# Patient Record
Sex: Female | Born: 1935 | Race: Black or African American | Hispanic: No | Marital: Married | Smoking: Never smoker
Health system: Southern US, Community
[De-identification: ages and names within clinical notes are randomized; demographics above are authoritative.]

## PROBLEM LIST (undated history)

## (undated) DIAGNOSIS — R001 Bradycardia, unspecified: Secondary | ICD-10-CM

## (undated) DIAGNOSIS — R55 Syncope and collapse: Secondary | ICD-10-CM

## (undated) DIAGNOSIS — I499 Cardiac arrhythmia, unspecified: Secondary | ICD-10-CM

## (undated) DIAGNOSIS — Z950 Presence of cardiac pacemaker: Secondary | ICD-10-CM

## (undated) DIAGNOSIS — I10 Essential (primary) hypertension: Secondary | ICD-10-CM

## (undated) HISTORY — DX: Essential (primary) hypertension: I10

## (undated) HISTORY — DX: Bradycardia, unspecified: R00.1

## (undated) HISTORY — DX: Syncope and collapse: R55

## (undated) HISTORY — DX: Presence of cardiac pacemaker: Z95.0

## (undated) HISTORY — DX: Cardiac arrhythmia, unspecified: I49.9

---

## 1998-04-17 ENCOUNTER — Ambulatory Visit (HOSPITAL_COMMUNITY): Admission: RE | Admit: 1998-04-17 | Discharge: 1998-04-17 | Payer: Self-pay | Admitting: Cardiology

## 2001-05-31 ENCOUNTER — Encounter: Payer: Self-pay | Admitting: Cardiology

## 2001-05-31 ENCOUNTER — Ambulatory Visit (HOSPITAL_COMMUNITY): Admission: RE | Admit: 2001-05-31 | Discharge: 2001-05-31 | Payer: Self-pay | Admitting: *Deleted

## 2002-10-16 ENCOUNTER — Encounter: Admission: RE | Admit: 2002-10-16 | Discharge: 2002-10-16 | Payer: Self-pay | Admitting: Cardiology

## 2002-10-16 ENCOUNTER — Encounter: Payer: Self-pay | Admitting: Cardiology

## 2006-01-06 ENCOUNTER — Encounter: Admission: RE | Admit: 2006-01-06 | Discharge: 2006-01-06 | Payer: Self-pay | Admitting: Cardiology

## 2008-05-08 ENCOUNTER — Inpatient Hospital Stay (HOSPITAL_COMMUNITY): Admission: EM | Admit: 2008-05-08 | Discharge: 2008-05-10 | Payer: Self-pay | Admitting: Emergency Medicine

## 2008-05-08 ENCOUNTER — Ambulatory Visit: Payer: Self-pay | Admitting: Internal Medicine

## 2008-05-09 ENCOUNTER — Encounter: Payer: Self-pay | Admitting: Internal Medicine

## 2008-05-09 HISTORY — PX: OTHER SURGICAL HISTORY: SHX169

## 2008-08-27 ENCOUNTER — Ambulatory Visit: Payer: Self-pay | Admitting: Internal Medicine

## 2009-02-21 ENCOUNTER — Encounter: Payer: Self-pay | Admitting: Internal Medicine

## 2009-04-30 DIAGNOSIS — I499 Cardiac arrhythmia, unspecified: Secondary | ICD-10-CM | POA: Insufficient documentation

## 2009-04-30 DIAGNOSIS — Z950 Presence of cardiac pacemaker: Secondary | ICD-10-CM

## 2009-04-30 DIAGNOSIS — I10 Essential (primary) hypertension: Secondary | ICD-10-CM

## 2009-04-30 DIAGNOSIS — I498 Other specified cardiac arrhythmias: Secondary | ICD-10-CM

## 2009-04-30 DIAGNOSIS — R55 Syncope and collapse: Secondary | ICD-10-CM | POA: Insufficient documentation

## 2009-05-12 ENCOUNTER — Ambulatory Visit: Payer: Self-pay | Admitting: Internal Medicine

## 2009-05-12 DIAGNOSIS — I4891 Unspecified atrial fibrillation: Secondary | ICD-10-CM | POA: Insufficient documentation

## 2009-05-16 ENCOUNTER — Ambulatory Visit: Payer: Self-pay | Admitting: Cardiovascular Disease

## 2009-05-16 ENCOUNTER — Encounter (INDEPENDENT_AMBULATORY_CARE_PROVIDER_SITE_OTHER): Payer: Self-pay | Admitting: Cardiology

## 2009-05-21 ENCOUNTER — Encounter (INDEPENDENT_AMBULATORY_CARE_PROVIDER_SITE_OTHER): Payer: Self-pay | Admitting: Cardiology

## 2009-05-21 ENCOUNTER — Ambulatory Visit: Payer: Self-pay | Admitting: Cardiovascular Disease

## 2009-05-21 LAB — CONVERTED CEMR LAB: Prothrombin Time: 20.2 s

## 2009-05-28 ENCOUNTER — Ambulatory Visit: Payer: Self-pay | Admitting: Cardiology

## 2009-05-28 LAB — CONVERTED CEMR LAB
POC INR: 5.3
Prothrombin Time: 27.9 s

## 2009-06-05 ENCOUNTER — Ambulatory Visit: Payer: Self-pay | Admitting: Cardiovascular Disease

## 2009-06-05 ENCOUNTER — Ambulatory Visit: Payer: Self-pay | Admitting: Internal Medicine

## 2009-06-05 LAB — CONVERTED CEMR LAB
POC INR: 3.6
Prothrombin Time: 22.9 s

## 2009-06-17 ENCOUNTER — Ambulatory Visit: Payer: Self-pay | Admitting: Cardiology

## 2009-06-17 LAB — CONVERTED CEMR LAB
POC INR: 2.5
Prothrombin Time: 19.3 s

## 2009-07-04 ENCOUNTER — Ambulatory Visit: Payer: Self-pay | Admitting: Cardiology

## 2009-07-28 ENCOUNTER — Ambulatory Visit: Payer: Self-pay | Admitting: Cardiovascular Disease

## 2009-08-25 ENCOUNTER — Ambulatory Visit: Payer: Self-pay | Admitting: Internal Medicine

## 2009-08-25 LAB — CONVERTED CEMR LAB: POC INR: 2.8

## 2009-09-04 ENCOUNTER — Ambulatory Visit: Payer: Self-pay | Admitting: Internal Medicine

## 2009-09-22 ENCOUNTER — Ambulatory Visit: Payer: Self-pay | Admitting: Internal Medicine

## 2009-10-17 ENCOUNTER — Encounter (INDEPENDENT_AMBULATORY_CARE_PROVIDER_SITE_OTHER): Payer: Self-pay | Admitting: Cardiology

## 2009-10-17 ENCOUNTER — Ambulatory Visit: Payer: Self-pay | Admitting: Cardiovascular Disease

## 2009-10-17 LAB — CONVERTED CEMR LAB: POC INR: 3.6

## 2009-11-06 ENCOUNTER — Ambulatory Visit: Payer: Self-pay | Admitting: Cardiology

## 2009-11-06 LAB — CONVERTED CEMR LAB: POC INR: 2.5

## 2009-12-04 ENCOUNTER — Ambulatory Visit: Payer: Self-pay | Admitting: Cardiology

## 2009-12-04 ENCOUNTER — Ambulatory Visit: Payer: Self-pay | Admitting: Internal Medicine

## 2009-12-04 LAB — CONVERTED CEMR LAB: POC INR: 1.9

## 2009-12-25 ENCOUNTER — Ambulatory Visit: Payer: Self-pay | Admitting: Cardiology

## 2009-12-25 LAB — CONVERTED CEMR LAB: POC INR: 2

## 2010-01-15 ENCOUNTER — Ambulatory Visit: Payer: Self-pay | Admitting: Cardiology

## 2010-02-12 ENCOUNTER — Ambulatory Visit: Payer: Self-pay | Admitting: Internal Medicine

## 2010-02-12 LAB — CONVERTED CEMR LAB: POC INR: 2

## 2010-03-05 ENCOUNTER — Ambulatory Visit: Payer: Self-pay | Admitting: Internal Medicine

## 2010-03-12 ENCOUNTER — Ambulatory Visit: Payer: Self-pay | Admitting: Cardiology

## 2010-04-09 ENCOUNTER — Ambulatory Visit: Payer: Self-pay | Admitting: Cardiology

## 2010-04-17 ENCOUNTER — Telehealth: Payer: Self-pay | Admitting: Nurse Practitioner

## 2010-05-07 ENCOUNTER — Ambulatory Visit: Payer: Self-pay | Admitting: Internal Medicine

## 2010-05-22 ENCOUNTER — Ambulatory Visit: Payer: Self-pay | Admitting: Cardiology

## 2010-05-22 LAB — CONVERTED CEMR LAB: POC INR: 2.5

## 2010-06-04 ENCOUNTER — Ambulatory Visit: Payer: Self-pay | Admitting: Internal Medicine

## 2010-06-15 ENCOUNTER — Ambulatory Visit: Payer: Self-pay | Admitting: Cardiovascular Disease

## 2010-06-15 ENCOUNTER — Ambulatory Visit: Payer: Self-pay | Admitting: Internal Medicine

## 2010-07-13 ENCOUNTER — Ambulatory Visit: Payer: Self-pay | Admitting: Cardiology

## 2010-08-10 ENCOUNTER — Ambulatory Visit: Payer: Self-pay | Admitting: Cardiovascular Disease

## 2010-08-10 LAB — CONVERTED CEMR LAB: POC INR: 2.7

## 2010-09-03 ENCOUNTER — Ambulatory Visit: Payer: Self-pay | Admitting: Internal Medicine

## 2010-09-07 ENCOUNTER — Ambulatory Visit: Payer: Self-pay | Admitting: Internal Medicine

## 2010-09-07 LAB — CONVERTED CEMR LAB: POC INR: 2.3

## 2010-10-05 ENCOUNTER — Ambulatory Visit: Payer: Self-pay | Admitting: Cardiovascular Disease

## 2010-10-05 LAB — CONVERTED CEMR LAB: POC INR: 1.8

## 2010-11-03 ENCOUNTER — Ambulatory Visit: Payer: Self-pay | Admitting: Cardiology

## 2010-12-03 ENCOUNTER — Encounter: Payer: Self-pay | Admitting: Internal Medicine

## 2010-12-05 ENCOUNTER — Encounter: Payer: Self-pay | Admitting: Cardiology

## 2010-12-08 ENCOUNTER — Ambulatory Visit: Admission: RE | Admit: 2010-12-08 | Discharge: 2010-12-08 | Payer: Self-pay | Source: Home / Self Care

## 2010-12-08 LAB — CONVERTED CEMR LAB: POC INR: 1.9

## 2010-12-10 ENCOUNTER — Ambulatory Visit: Payer: Self-pay | Admitting: Internal Medicine

## 2010-12-15 NOTE — Medication Information (Signed)
Summary: rov coumadin - [facility]  Anticoagulant Therapy  Managed by: Weston Brass, PharmD Referring MD: Lewayne Bunting, MD Supervising MD: Daleen Squibb MD, Maisie Fus Indication 1: Atrial Fibrillation Lab Used: [facility] [place] Site: [address] INR POC 2.1 INR RANGE 2 - 3  Dietary changes: no    Health status changes: no    Bleeding/hemorrhagic complications: no    Recent/future hospitalizations: no    Any changes in medication regimen? no    Recent/future dental: no  Any missed doses?: no       Is patient compliant with meds? yes       Allergies: No Known Drug Allergies  Anticoagulation Management History:      The patient is taking warfarin and comes in today for a routine follow up visit.  Positive risk factors for bleeding include an age of 75 years or older.  The bleeding index is 'intermediate risk'.  Positive CHADS2 values include History of HTN and Age > 75 years old.  Anticoagulation responsible provider: Daleen Squibb MD, Maisie Fus.  INR POC: 2.1.  Cuvette Lot#: [id number].  Exp: 08/12.    Anticoagulation Management Assessment/Plan:      The patient's current anticoagulation dose is Coumadin 3 mg tabs: one daily as directed per coumadin clinic.  The target INR is 2.0-3.0.  The next INR is due 05/07/2010.  Anticoagulation instructions were given to patient.  Results were reviewed/authorized by Weston Brass, PharmD.  She was notified by Weston Brass PharmD.         Prior Anticoagulation Instructions: INR 2.0  coumadin 1 tab =3mg  Tu, Thu, Sat, Sun 1/2 tab = 1.5mg  on Mon, Wed, Fri  Current Anticoagulation Instructions: INR 2.1  Continue same dose of 1 tablet every day except 1/2 tablet on Monday, Wednesday and Friday

## 2010-12-15 NOTE — Cardiovascular Report (Signed)
Summary: TTM   TTM   Imported By: Roderic Ovens 09/15/2010 15:21:29  _____________________________________________________________________  External Attachment:    Type:   Image     Comment:   External Document

## 2010-12-15 NOTE — Cardiovascular Report (Signed)
Summary: TTM   TTM   Imported By: Roderic Ovens 01/12/2010 13:32:47  _____________________________________________________________________  External Attachment:    Type:   Image     Comment:   External Document

## 2010-12-15 NOTE — Medication Information (Signed)
Summary: rov coumadin - [facility]  Anticoagulant Therapy  Managed by: Leota Sauers, PharmD, BCPS, CPP Referring MD: Lewayne Bunting, MD Supervising MD: Daleen Squibb MD, Maisie Fus Indication 1: Atrial Fibrillation Lab Used: [facility]  Site: [address] INR POC 2.0 INR RANGE 2 - 3  Dietary changes: no    Health status changes: no    Bleeding/hemorrhagic complications: no    Recent/future hospitalizations: no    Any changes in medication regimen? no    Recent/future dental: no  Any missed doses?: no       Is patient compliant with meds? yes       Current Medications (verified): 1)  Lumigan 0.03 % Soln (Bimatoprost) .Marland Kitchen.. 1 Drop Both Eyes Daily 2)  Multivitamins   Tabs (Multiple Vitamin) .Marland Kitchen.. 1 By Mouth Once Daily 3)  Dorzolamide Hcl 2 % Soln (Dorzolamide Hcl) .Marland Kitchen.. 1 Drop Both Eyes Two Times A Day 4)  Exforge 10-160 Mg Tabs (Amlodipine Besylate-Valsartan) .Marland Kitchen.. 1 By Mouth Once Daily 5)  Coumadin 3 Mg Tabs (Warfarin Sodium) .... One Daily As Directed Per Coumadin Clinic 6)  Maxzide-25 37.5-25 Mg Tabs (Triamterene-Hctz) .... One By Mouth Once Daily  Allergies (verified): No Known Drug Allergies  Anticoagulation Management History:      The patient is taking warfarin and comes in today for a routine follow up visit.  Positive risk factors for bleeding include an age of 14 years or older.  The bleeding index is 'intermediate risk'.  Positive CHADS2 values include History of HTN and Age > 52 years old.  Anticoagulation responsible provider: Daleen Squibb MD, Maisie Fus.  INR POC: 2.0.  Cuvette Lot#: [id number].  Exp: 03/2011.    Anticoagulation Management Assessment/Plan:      The patient's current anticoagulation dose is Coumadin 3 mg tabs: one daily as directed per coumadin clinic.  The next INR is due 04/09/2010.  Anticoagulation instructions were given to patient.  Results were reviewed/authorized by Leota Sauers, PharmD, BCPS, CPP.         Prior Anticoagulation Instructions: INR  2.0  Coumadin 1/2 tab = 1.5mg  on Mon, Wed, Fri 1 tab = 3mg  Tu, Thu, Sat, Sun  Current Anticoagulation Instructions: INR 2.0  coumadin 1 tab =3mg  Tu, Thu, Sat, Sun 1/2 tab = 1.5mg  on Mon, Wed, Fri

## 2010-12-15 NOTE — Medication Information (Signed)
Summary: rov/ewj  Anticoagulant Therapy  Managed by: Leota Sauers, PharmD, BCPS, CPP Referring MD: Lewayne Bunting, MD Supervising MD: Ladona Ridgel MD, Sharlot Gowda Indication 1: Atrial Fibrillation Lab Used: [facility] [place] Site: [address] INR POC 2.0 INR RANGE 2 - 3  Dietary changes: no    Health status changes: no    Bleeding/hemorrhagic complications: no    Recent/future hospitalizations: no    Any changes in medication regimen? no    Recent/future dental: no  Any missed doses?: no       Is patient compliant with meds? yes       Current Medications (verified): 1)  Lumigan 0.03 % Soln (Bimatoprost) .Marland Kitchen.. 1 Drop Both Eyes Daily 2)  Multivitamins   Tabs (Multiple Vitamin) .Marland Kitchen.. 1 By Mouth Once Daily 3)  Dorzolamide Hcl 2 % Soln (Dorzolamide Hcl) .Marland Kitchen.. 1 Drop Both Eyes Two Times A Day 4)  Exforge 10-160 Mg Tabs (Amlodipine Besylate-Valsartan) .Marland Kitchen.. 1 By Mouth Once Daily 5)  Coumadin 3 Mg Tabs (Warfarin Sodium) .... One Daily As Directed Per Coumadin Clinic 6)  Maxzide-25 37.5-25 Mg Tabs (Triamterene-Hctz) .... One By Mouth Once Daily  Allergies (verified): No Known Drug Allergies  Anticoagulation Management History:      The patient is taking warfarin and comes in today for a routine follow up visit.  Positive risk factors for bleeding include an age of 28 years or older.  The bleeding index is 'intermediate risk'.  Positive CHADS2 values include History of HTN and Age > 7 years old.  Anticoagulation responsible provider: Ladona Ridgel MD, Sharlot Gowda.  INR POC: 2.0.  Cuvette Lot#: [id number].  Exp: 03/2011.    Anticoagulation Management Assessment/Plan:      The patient's current anticoagulation dose is Coumadin 3 mg tabs: one daily as directed per coumadin clinic.  The next INR is due 03/12/2010.  Anticoagulation instructions were given to patient.  Results were reviewed/authorized by Leota Sauers, PharmD, BCPS, CPP.         Prior Anticoagulation Instructions: INR  2.5  Continue on same dosage 1 tablet daily except 1/2 tablet on Mondays, Wednesdays, and Fridays.  Recheck in 4 weeks.    Current Anticoagulation Instructions: INR 2.0  Coumadin 1/2 tab = 1.5mg  on Mon, Wed, Fri 1 tab = 3mg  Tu, Thu, Sat, Sun

## 2010-12-15 NOTE — Assessment & Plan Note (Signed)
Summary: 1 year rov   Visit Type:  Follow-up Primary Provider:  Shana Chute, Md   History of Present Illness: [name] [name] returns today for PPM and atrial fibrillation followup.  She is a pleasant patient of Dr. Magda Kiel with a h/o atrial fibrillation and bradycardia.  She was started on coumadin in her last visit for thromboembolic prevention.  She denies c/p or sob. she has not had any falls.  She admits to some sodium indiscretion and has had some swelling in her feet.    Current Medications (verified): 1)  Lumigan 0.03 % Soln (Bimatoprost) .Marland Kitchen.. 1 Drop Both Eyes Daily 2)  Multivitamins   Tabs (Multiple Vitamin) .Marland Kitchen.. 1 By Mouth Once Daily 3)  Dorzolamide Hcl 2 % Soln (Dorzolamide Hcl) .Marland Kitchen.. 1 Drop Both Eyes Two Times A Day 4)  Exforge 10-160 Mg Tabs (Amlodipine Besylate-Valsartan) .Marland Kitchen.. 1 By Mouth Once Daily 5)  Coumadin 3 Mg Tabs (Warfarin Sodium) .... One Daily As Directed Per Coumadin Clinic 6)  Maxzide-25 37.5-25 Mg Tabs (Triamterene-Hctz) .... One By Mouth Once Daily  Allergies (verified): No Known Drug Allergies  Past History:  Past Medical History: Last updated: 04/30/2009 CARDIAC ARRHYTHMIA (ICD-427.9) HYPERTENSION (ICD-401.9) PACEMAKER, PERMANENT (ICD-V45.01) SYNCOPE (ICD-780.2) BRADYCARDIA (ICD-427.89)  Past Surgical History: Last updated: 04/30/2009  Implantation of dual-chamber pacemaker [employer] dual-chamber  05/09/2008 Dr.Demarques Pilz  Social History: Last updated: 04/30/2009 Tobacco Use - No.  Alcohol Use - no Drug Use - no  Review of Systems       The patient complains of peripheral edema.  The patient denies chest pain, syncope, and dyspnea on exertion.    Vital Signs:  Patient profile:   77 year old female Height:      62 inches Weight:      130 pounds BMI:     23.86 Pulse rate:   59 / minute BP sitting:   122 / 60  (left arm)  Vitals Entered By: Laurance Flatten CMA (June 15, 2010 2:18 PM)  Physical Exam  General:  Well developed, well nourished, in no  acute distress. Head:  normocephalic and atraumatic Eyes:  PERRLA/EOM intact; conjunctiva and lids normal. Mouth:  Teeth, gums and palate normal. Oral mucosa normal. Neck:  Neck supple, no JVD. No masses, thyromegaly or abnormal cervical nodes. Chest Wall:  Well healed PPM incision. Lungs:  Clear bilaterally to auscultation with no wheezes, rales, or rhonchi. Heart:  RRR with normal S1 and S2.  PMI is not enlarged or laterally displaced. Abdomen:  Bowel sounds positive; abdomen soft and non-tender without masses, organomegaly, or hernias noted. No hepatosplenomegaly. Msk:  Back normal, normal gait. Muscle strength and tone normal. Pulses:  pulses normal in all 4 extremities Extremities:  No clubbing or cyanosis. Neurologic:  Alert and oriented x 3.   PPM Specifications Following MD:  Lewayne Bunting, MD     PPM Vendor:  [employer]     PPM Model Number:  [id number]     PPM Serial Number:  [id number] PPM DOI:  05/09/2008     PPM Implanting MD:  Lewayne Bunting, MD  Lead 1    Location: [facility]     DOI: 05/09/2008     Model #: [id number]     Serial #: [id number]     Status: active Lead 2    Location: [facility]     DOI: 05/09/2008     Model #: [id number]     Serial #: [id number]     Status: active   Indications:  BRADYCARDIA  PPM Follow Up Battery Voltage:  2.79 V     Battery Est. Longevity:  4.50-5.15yrs     Pacer Dependent:  No       PPM Device Measurements Atrium  Amplitude: 3.9 mV, Impedance: 364 ohms, Threshold: 0.75 V at 0.4 msec Right Ventricle  Amplitude: 9.1 mV, Impedance: 413 ohms, Threshold: 0.750 V at 0.4 msec  Episodes MS Episodes:  3     Percent Mode Switch:  <1%     Coumadin:  Yes Ventricular High Rate:  0     Atrial Pacing:  99%     Ventricular Pacing:  <1%  Parameters Mode:  DDDR     Lower Rate Limit:  60     Upper Rate Limit:  100 Paced AV Delay:  250     Sensed AV Delay:  250 Next Cardiology Appt Due:  12/17/2010 Tech Comments:  3 AMS EPISODES--LONGEST WAS 10 HRS 13 MINUTES.  +COUMADIN.  NORMAL DEVICE  FUNCTION.   NO CHANGES MADE.  ROV IN 6 MTHS W/DEVICE CLINIC.  MEDNET CHECK IN [place] MD Comments:  Agree with above.  Impression & Recommendations:  Problem # 1:  PACEMAKER, PERMANENT (ICD-V45.01) Her PPM is working normally.  Will recheck in several months.  Problem # 2:  ATRIAL FIBRILLATION (ICD-427.31) She remains asymptomatic.  She will continue warfarin. Her updated medication list for this problem includes:    Coumadin 3 Mg Tabs (Warfarin sodium) ..... One daily as directed per coumadin clinic  Problem # 3:  HYPERTENSION (ICD-401.9) Her blood pressure is well controlled.  She will continue her meds as below and maintain a low sodium diet. Her updated medication list for this problem includes:    Exforge 10-160 Mg Tabs (Amlodipine besylate-valsartan) .Marland Kitchen... 1 by mouth once daily    Maxzide-25 37.5-25 Mg Tabs (Triamterene-hctz) ..... One by mouth once daily  Patient Instructions: 1)  Your physician recommends that you schedule a follow-up appointment in: 12 months with Dr Ladona Ridgel

## 2010-12-15 NOTE — Medication Information (Signed)
Summary: rov/sl  Anticoagulant Therapy  Managed by: Weston Brass, PharmD Referring MD: Lewayne Bunting, MD PCP: Shana Chute, Md Supervising MD: Myrtis Ser MD, Tinnie Gens Indication 1: Atrial Fibrillation Lab Used: [facility] [place] Site: [address] INR POC 2.8 INR RANGE 2 - 3  Dietary changes: no    Health status changes: no    Bleeding/hemorrhagic complications: no    Recent/future hospitalizations: no    Any changes in medication regimen? no    Recent/future dental: no  Any missed doses?: no       Is patient compliant with meds? yes       Allergies: No Known Drug Allergies  Anticoagulation Management History:      The patient is taking warfarin and comes in today for a routine follow up visit.  Positive risk factors for bleeding include an age of 75 years or older.  The bleeding index is 'intermediate risk'.  Positive CHADS2 values include History of HTN and Age > 58 years old.  Anticoagulation responsible provider: Myrtis Ser MD, Tinnie Gens.  INR POC: 2.8.  Cuvette Lot#: [id number].  Exp: 08/2011.    Anticoagulation Management Assessment/Plan:      The patient's current anticoagulation dose is Coumadin 3 mg tabs: one daily as directed per coumadin clinic.  The target INR is 2.0-3.0.  The next INR is due 08/10/2010.  Anticoagulation instructions were given to patient.  Results were reviewed/authorized by Weston Brass, PharmD.  She was notified by Liana Gerold, PharmD Candidate.         Prior Anticoagulation Instructions: INR 2.1   Continue on Coumadin 1 tab on Sun, Tue, Thur, Sat and Coumadin 0.5 tab on Mon, Wed, Friday. Return to clinic in 4 weeks.  Current Anticoagulation Instructions: INR 2.8  Continue 1 tablet daily except 1/2 on Mon, Wed and Fri.  Return to clinic in 4 weeks.

## 2010-12-15 NOTE — Medication Information (Signed)
Summary: [name] [name]  Anticoagulant Therapy  Managed by: Cloyde Reams, RN, BSN Referring MD: Lewayne Bunting, MD Supervising MD: Jens Som MD, Arlys John Indication 1: Atrial Fibrillation Lab Used: [facility] [place] Site: [address] INR POC 2.5 INR RANGE 2 - 3  Dietary changes: yes       Details: Eating a lot of greens per pt report.    Health status changes: no    Bleeding/hemorrhagic complications: no    Recent/future hospitalizations: no    Any changes in medication regimen? no    Recent/future dental: no  Any missed doses?: no       Is patient compliant with meds? yes       Allergies (verified): No Known Drug Allergies  Anticoagulation Management History:      The patient is taking warfarin and comes in today for a routine follow up visit.  Positive risk factors for bleeding include an age of 91 years or older.  The bleeding index is 'intermediate risk'.  Positive CHADS2 values include History of HTN and Age > 87 years old.  Anticoagulation responsible provider: Jens Som MD, Arlys John.  INR POC: 2.5.  Cuvette Lot#: [id number].  Exp: 03/2011.    Anticoagulation Management Assessment/Plan:      The patient's current anticoagulation dose is Coumadin 3 mg tabs: one daily as directed per coumadin clinic.  The next INR is due 02/12/2010.  Anticoagulation instructions were given to patient.  Results were reviewed/authorized by Cloyde Reams, RN, BSN.  She was notified by Cloyde Reams RN.         Prior Anticoagulation Instructions: INR 2.0 Change dose to 1 pill everyday except 1/2 pill on Mondays, Wednesdays and Fridays. Recheck in 3 weeks.   Current Anticoagulation Instructions: INR 2.5  Continue on same dosage 1 tablet daily except 1/2 tablet on Mondays, Wednesdays, and Fridays.  Recheck in 4 weeks.

## 2010-12-15 NOTE — Medication Information (Signed)
Summary: rov/cb  Anticoagulant Therapy  Managed by: Shelby Dubin, PharmD, BCPS, CPP Referring MD: Lewayne Bunting, MD Supervising MD: Riley Kill MD, Maisie Fus Indication 1: Atrial Fibrillation Lab Used: [facility] [place] Site: [address] INR POC 1.9 INR RANGE 2 - 3  Dietary changes: no    Health status changes: no    Bleeding/hemorrhagic complications: no    Recent/future hospitalizations: no    Any changes in medication regimen? yes       Details: Started Doxycycline 100mg  twice daily dose for 5 days ( on 11/20/2009) and besivance (besifloxacin) eye drop on 11/20/2009  Recent/future dental: no  Any missed doses?: no       Is patient compliant with meds? yes       Allergies: No Known Drug Allergies  Anticoagulation Management History:      The patient is taking warfarin and comes in today for a routine follow up visit.  Positive risk factors for bleeding include an age of 75 years or older.  The bleeding index is 'intermediate risk'.  Positive CHADS2 values include History of HTN and Age > 75 years old.  Anticoagulation responsible provider: Riley Kill MD, Maisie Fus.  INR POC: 1.9.  Cuvette Lot#: [id number].  Exp: 12/2010.    Anticoagulation Management Assessment/Plan:      The patient's current anticoagulation dose is Coumadin 3 mg tabs: one daily as directed per coumadin clinic.  The next INR is due 12/25/2009.  Anticoagulation instructions were given to patient.  Results were reviewed/authorized by Shelby Dubin, PharmD, BCPS, CPP.  She was notified by Ysidro Evert, Pharm D Candidate.         Prior Anticoagulation Instructions: INR 2.5. Take 0.5 tablet daily except 1 tablet Tuesday, Thursday, and Sunday.  Current Anticoagulation Instructions: INR: 1.9 Take extra 1/2 tablet ( total of 1 tablet = 3mg ) tomorrow then resume to same dosage of 1/2 tablet daily except 1 tablet on Sundays, Tuesdays and Thursdays Recheck in 3 weeks

## 2010-12-15 NOTE — Cardiovascular Report (Signed)
Summary: Office Visit   Office Visit   Imported By: Roderic Ovens 06/16/2010 09:32:52  _____________________________________________________________________  External Attachment:    Type:   Image     Comment:   External Document

## 2010-12-15 NOTE — Medication Information (Signed)
Summary: rov/ewj  Anticoagulant Therapy  Managed by: Reina Fuse, PharmD Referring MD: Lewayne Bunting, MD Supervising MD: Eden Emms MD, Theron Arista Indication 1: Atrial Fibrillation Lab Used: [facility] [place] Site: [address] INR POC 2.1 INR RANGE 2 - 3  Dietary changes: no    Health status changes: no    Bleeding/hemorrhagic complications: no    Recent/future hospitalizations: no    Any changes in medication regimen? no    Recent/future dental: no  Any missed doses?: no       Is patient compliant with meds? yes       Current Medications (verified): 1)  Lumigan 0.03 % Soln (Bimatoprost) .Marland Kitchen.. 1 Drop Both Eyes Daily 2)  Multivitamins   Tabs (Multiple Vitamin) .Marland Kitchen.. 1 By Mouth Once Daily 3)  Dorzolamide Hcl 2 % Soln (Dorzolamide Hcl) .Marland Kitchen.. 1 Drop Both Eyes Two Times A Day 4)  Exforge 10-160 Mg Tabs (Amlodipine Besylate-Valsartan) .Marland Kitchen.. 1 By Mouth Once Daily 5)  Coumadin 3 Mg Tabs (Warfarin Sodium) .... One Daily As Directed Per Coumadin Clinic 6)  Maxzide-25 37.5-25 Mg Tabs (Triamterene-Hctz) .... One By Mouth Once Daily  Allergies (verified): No Known Drug Allergies  Anticoagulation Management History:      The patient is taking warfarin and comes in today for a routine follow up visit.  Positive risk factors for bleeding include an age of 64 years or older.  The bleeding index is 'intermediate risk'.  Positive CHADS2 values include History of HTN and Age > 14 years old.  Anticoagulation responsible provider: Eden Emms MD, Theron Arista.  INR POC: 2.1.  Cuvette Lot#: [id number].  Exp: 08/2011.    Anticoagulation Management Assessment/Plan:      The patient's current anticoagulation dose is Coumadin 3 mg tabs: one daily as directed per coumadin clinic.  The target INR is 2.0-3.0.  The next INR is due 07/13/2010.  Anticoagulation instructions were given to patient.  Results were reviewed/authorized by Reina Fuse, PharmD.  She was notified by Reina Fuse PharmD.         Prior  Anticoagulation Instructions: INR 2.5  Continue on same dosage 1 tablet daily except 1/2 tablet on Mondays, Wednesdays, and Fridays.  Recheck in 3 weeks.   Current Anticoagulation Instructions: INR 2.1   Continue on Coumadin 1 tab on Sun, Tue, Thur, Sat and Coumadin 0.5 tab on Mon, Wed, Friday. Return to clinic in 4 weeks.

## 2010-12-15 NOTE — Medication Information (Signed)
Summary: rov/ewj  Anticoagulant Therapy  Managed by: Leota Sauers, PharmD, BCPS, CPP Referring MD: Lewayne Bunting, MD PCP: Shana Chute Md Supervising MD: Excell Seltzer MD, Casimiro Needle Indication 1: Atrial Fibrillation Lab Used: [facility] [place] Site: [address] INR POC 1.8 INR RANGE 2 - 3  Dietary changes: no    Health status changes: no    Bleeding/hemorrhagic complications: no    Recent/future hospitalizations: no    Any changes in medication regimen? no    Recent/future dental: no  Any missed doses?: no       Is patient compliant with meds? yes       Current Medications (verified): 1)  Lumigan 0.03 % Soln (Bimatoprost) .Marland Kitchen.. 1 Drop Both Eyes Daily 2)  Multivitamins   Tabs (Multiple Vitamin) .Marland Kitchen.. 1 By Mouth Once Daily 3)  Dorzolamide Hcl 2 % Soln (Dorzolamide Hcl) .Marland Kitchen.. 1 Drop Both Eyes Two Times A Day 4)  Exforge 10-160 Mg Tabs (Amlodipine Besylate-Valsartan) .Marland Kitchen.. 1 By Mouth Once Daily 5)  Coumadin 3 Mg Tabs (Warfarin Sodium) .... One Daily As Directed Per Coumadin Clinic 6)  Maxzide-25 37.5-25 Mg Tabs (Triamterene-Hctz) .... One By Mouth Once Daily  Allergies (verified): No Known Drug Allergies  Anticoagulation Management History:      The patient is taking warfarin and comes in today for a routine follow up visit.  Positive risk factors for bleeding include an age of 75 years or older.  The bleeding index is 'intermediate risk'.  Positive CHADS2 values include History of HTN and Age > 75 years old.  Anticoagulation responsible provider: Excell Seltzer MD, Casimiro Needle.  INR POC: 1.8.  Cuvette Lot#: [id number].  Exp: 10/2011.    Anticoagulation Management Assessment/Plan:      The patient's current anticoagulation dose is Coumadin 3 mg tabs: one daily as directed per coumadin clinic.  The target INR is 2.0-3.0.  The next INR is due 11/03/2010.  Anticoagulation instructions were given to patient.  Results were reviewed/authorized by Leota Sauers, PharmD, BCPS, CPP.           Prior Anticoagulation Instructions: INR 2.3  Continue on same dosage 1 tablet daily except 1/2 tablet on Mondays, Wednesdays, and Fridays.  Recheck in 4 weeks.    Current Anticoagulation Instructions: INR 1.8  Couamdin 3mg  tabs, Take 1 whole tab today then 1 tab TUE, THUR, SAT, SUN and 1/2 tab on MON, WED, FRI

## 2010-12-15 NOTE — Medication Information (Signed)
Summary: [name] [name]  Anticoagulant Therapy  Managed by: Cloyde Reams, RN, BSN Referring MD: Lewayne Bunting, MD Supervising MD: Antoine Poche MD, Fayrene Fearing Indication 1: Atrial Fibrillation Lab Used: [facility] [place] Site: [address] INR POC 2.5 INR RANGE 2 - 3  Dietary changes: no    Health status changes: no    Bleeding/hemorrhagic complications: no    Recent/future hospitalizations: no    Any changes in medication regimen? no    Recent/future dental: no  Any missed doses?: no       Is patient compliant with meds? yes       Allergies: No Known Drug Allergies  Anticoagulation Management History:      The patient is taking warfarin and comes in today for a routine follow up visit.  Positive risk factors for bleeding include an age of 75 years or older.  The bleeding index is 'intermediate risk'.  Positive CHADS2 values include History of HTN and Age > 75 years old.  Anticoagulation responsible provider: Antoine Poche MD, Fayrene Fearing.  INR POC: 2.5.  Cuvette Lot#: [id number].  Exp: 07/2011.    Anticoagulation Management Assessment/Plan:      The patient's current anticoagulation dose is Coumadin 3 mg tabs: one daily as directed per coumadin clinic.  The target INR is 2.0-3.0.  The next INR is due 06/15/2010.  Anticoagulation instructions were given to patient.  Results were reviewed/authorized by Cloyde Reams, RN, BSN.  She was notified by Cloyde Reams RN.         Prior Anticoagulation Instructions: INR 1.4  Take an extra 1/2 tablet today then resume same dosage 1 tablet daily except 1/2 tablet on Mondays, Wednesdays, and Fridays.  Recheck in 2 weeks.    Current Anticoagulation Instructions: INR 2.5  Continue on same dosage 1 tablet daily except 1/2 tablet on Mondays, Wednesdays, and Fridays.  Recheck in 3 weeks.

## 2010-12-15 NOTE — Medication Information (Signed)
Summary: [name] [name]  Anticoagulant Therapy  Managed by: Cloyde Reams, RN, BSN Referring MD: Lewayne Bunting, MD PCP: Shana Chute Md Supervising MD: Johney Frame MD, Fayrene Fearing Indication 1: Atrial Fibrillation Lab Used: [facility] [place] Site: [address] INR POC 2.3 INR RANGE 2 - 3  Dietary changes: no    Health status changes: no    Bleeding/hemorrhagic complications: no    Recent/future hospitalizations: no    Any changes in medication regimen? no    Recent/future dental: no  Any missed doses?: no       Is patient compliant with meds? yes       Allergies: No Known Drug Allergies  Anticoagulation Management History:      The patient is taking warfarin and comes in today for a routine follow up visit.  Positive risk factors for bleeding include an age of 75 years or older.  The bleeding index is 'intermediate risk'.  Positive CHADS2 values include History of HTN and Age > 47 years old.  Anticoagulation responsible provider: Karianne Nogueira MD, Fayrene Fearing.  INR POC: 2.3.  Cuvette Lot#: [id number].  Exp: 10/2011.    Anticoagulation Management Assessment/Plan:      The patient's current anticoagulation dose is Coumadin 3 mg tabs: one daily as directed per coumadin clinic.  The target INR is 2.0-3.0.  The next INR is due 10/05/2010.  Anticoagulation instructions were given to patient.  Results were reviewed/authorized by Cloyde Reams, RN, BSN.  She was notified by Cloyde Reams RN.         Prior Anticoagulation Instructions: INR 2.7  Continue on same dosage 1 tablet daily except 1/2 tablet on Mondays, Wednesdays, and Fridays.  Recheck in 4 weeks.    Current Anticoagulation Instructions: INR 2.3  Continue on same dosage 1 tablet daily except 1/2 tablet on Mondays, Wednesdays, and Fridays.  Recheck in 4 weeks.

## 2010-12-15 NOTE — Cardiovascular Report (Signed)
Summary: TTM   TTM   Imported By: Roderic Ovens 06/16/2010 14:36:14  _____________________________________________________________________  External Attachment:    Type:   Image     Comment:   External Document

## 2010-12-15 NOTE — Medication Information (Signed)
Summary: rov/eh  Anticoagulant Therapy  Managed by: Bethena Midget, RN, BSN Referring MD: Lewayne Bunting, MD Supervising MD: Daleen Squibb MD, Maisie Fus Indication 1: Atrial Fibrillation Lab Used: [facility] [place] Site: [address] INR POC 2.0 INR RANGE 2 - 3  Dietary changes: no    Health status changes: no    Bleeding/hemorrhagic complications: no    Recent/future hospitalizations: no    Any changes in medication regimen? no    Recent/future dental: no  Any missed doses?: no       Is patient compliant with meds? yes       Allergies: No Known Drug Allergies  Anticoagulation Management History:      The patient is taking warfarin and comes in today for a routine follow up visit.  Positive risk factors for bleeding include an age of 65 years or older.  The bleeding index is 'intermediate risk'.  Positive CHADS2 values include History of HTN and Age > 18 years old.  Anticoagulation responsible provider: Daleen Squibb MD, Maisie Fus.  INR POC: 2.0.  Cuvette Lot#: [id number].  Exp: 02/2011.    Anticoagulation Management Assessment/Plan:      The patient's current anticoagulation dose is Coumadin 3 mg tabs: one daily as directed per coumadin clinic.  The next INR is due 01/15/2010.  Anticoagulation instructions were given to patient.  Results were reviewed/authorized by Bethena Midget, RN, BSN.  She was notified by Bethena Midget, RN, BSN.         Prior Anticoagulation Instructions: INR: 1.9 Take extra 1/2 tablet ( total of 1 tablet = 3mg ) tomorrow then resume to same dosage of 1/2 tablet daily except 1 tablet on Sundays, Tuesdays and Thursdays Recheck in 3 weeks  Current Anticoagulation Instructions: INR 2.0 Change dose to 1 pill everyday except 1/2 pill on Mondays, Wednesdays and Fridays. Recheck in 3 weeks.

## 2010-12-15 NOTE — Cardiovascular Report (Signed)
Summary: TTM   TTM   Imported By: Roderic Ovens 04/09/2010 11:39:52  _____________________________________________________________________  External Attachment:    Type:   Image     Comment:   External Document

## 2010-12-15 NOTE — Medication Information (Signed)
Summary: rov/sp  Anticoagulant Therapy  Managed by: Cloyde Reams, RN, BSN Referring MD: Lewayne Bunting, MD Supervising MD: Johney Frame MD, Fayrene Fearing Indication 1: Atrial Fibrillation Lab Used: [facility] [place] Site: [address] INR POC 1.4 INR RANGE 2 - 3  Dietary changes: no    Health status changes: no    Bleeding/hemorrhagic complications: no    Recent/future hospitalizations: no    Any changes in medication regimen? no    Recent/future dental: no  Any missed doses?: yes     Details: Missed 1 dosage on Monday.    Is patient compliant with meds? yes       Allergies: No Known Drug Allergies  Anticoagulation Management History:      The patient is taking warfarin and comes in today for a routine follow up visit.  Positive risk factors for bleeding include an age of 38 years or older.  The bleeding index is 'intermediate risk'.  Positive CHADS2 values include History of HTN and Age > 48 years old.  Anticoagulation responsible provider: Jawuan Robb MD, Fayrene Fearing.  INR POC: 1.4.  Cuvette Lot#: [id number].  Exp: 07/2011.    Anticoagulation Management Assessment/Plan:      The patient's current anticoagulation dose is Coumadin 3 mg tabs: one daily as directed per coumadin clinic.  The target INR is 2.0-3.0.  The next INR is due 05/21/2010.  Anticoagulation instructions were given to patient.  Results were reviewed/authorized by Cloyde Reams, RN, BSN.  She was notified by Cloyde Reams RN.         Prior Anticoagulation Instructions: INR 2.1  Continue same dose of 1 tablet every day except 1/2 tablet on Monday, Wednesday and Friday   Current Anticoagulation Instructions: INR 1.4  Take an extra 1/2 tablet today then resume same dosage 1 tablet daily except 1/2 tablet on Mondays, Wednesdays, and Fridays.  Recheck in 2 weeks.

## 2010-12-15 NOTE — Progress Notes (Signed)
Summary: Cardiology Phone Note - Medication refill  Phone Note Call from Patient   Caller: Daughter Summary of Call: received call from pts dtr, joyce gaines, stating that pt has only one triam-hctz 37.5-25 tab left and she is due to take it tomorrow.  she called pts pharmacy earlier this week and apparently our office has yet to refill the rx.  i called [facility] @ [phone] and provided refill authorization for triamterene-hctz 37.5-25mg  1 tab by mouth daily, #30, six refills. Initial call taken by: Creig Hines, ANP-BC,  April 17, 2010 6:50 PM

## 2010-12-15 NOTE — Medication Information (Signed)
Summary: rov/sp  Anticoagulant Therapy  Managed by: Cloyde Reams, RN, BSN Referring MD: Lewayne Bunting, MD PCP: Jess Barters Supervising MD: Excell Seltzer MD, Casimiro Needle Indication 1: Atrial Fibrillation Lab Used: [facility]  Site: [address] INR POC 2.7 INR RANGE 2 - 3  Dietary changes: no    Health status changes: no    Bleeding/hemorrhagic complications: no    Recent/future hospitalizations: no    Any changes in medication regimen? no    Recent/future dental: no  Any missed doses?: no       Is patient compliant with meds? yes       Allergies: No Known Drug Allergies  Anticoagulation Management History:      The patient is taking warfarin and comes in today for a routine follow up visit.  Positive risk factors for bleeding include an age of 75 years or older.  The bleeding index is 'intermediate risk'.  Positive CHADS2 values include History of HTN and Age > 73 years old.  Anticoagulation responsible provider: Excell Seltzer MD, Casimiro Needle.  INR POC: 2.7.  Cuvette Lot#: [id number].  Exp: 09/2011.    Anticoagulation Management Assessment/Plan:      The patient's current anticoagulation dose is Coumadin 3 mg tabs: one daily as directed per coumadin clinic.  The target INR is 2.0-3.0.  The next INR is due 09/07/2010.  Anticoagulation instructions were given to patient.  Results were reviewed/authorized by Cloyde Reams, RN, BSN.  She was notified by Weston Brass PharmD.         Prior Anticoagulation Instructions: INR 2.8  Continue 1 tablet daily except 1/2 on Mon, Wed and Fri.  Return to clinic in 4 weeks.  Current Anticoagulation Instructions: INR 2.7  Continue on same dosage 1 tablet daily except 1/2 tablet on Mondays, Wednesdays, and Fridays.  Recheck in 4 weeks.

## 2010-12-17 NOTE — Medication Information (Signed)
Summary: rov/sp  Anticoagulant Therapy  Managed by: Louann Sjogren, PharmD Referring MD: Lewayne Bunting, MD PCP: Shana Chute, Md Supervising MD: Eden Emms MD,Saya Mccoll Indication 1: Atrial Fibrillation Lab Used: [facility] [place] Site: [address] INR POC 1.9 INR RANGE 2 - 3  Dietary changes: no    Health status changes: no    Bleeding/hemorrhagic complications: no    Recent/future hospitalizations: no    Any changes in medication regimen? no    Recent/future dental: no  Any missed doses?: no       Is patient compliant with meds? yes       Allergies: No Known Drug Allergies  Anticoagulation Management History:      The patient is taking warfarin and comes in today for a routine follow up visit.  Positive risk factors for bleeding include an age of 75 years or older.  The bleeding index is 'intermediate risk'.  Positive CHADS2 values include History of HTN and Age > 75 years old.  Today's INR is 1.9.  Anticoagulation responsible provider: Eden Emms MD,Renai Lopata.  INR POC: 1.9.  Cuvette Lot#: [id number].  Exp: 12/2011.    Anticoagulation Management Assessment/Plan:      The patient's current anticoagulation dose is Coumadin 3 mg tabs: one daily as directed per coumadin clinic.  The target INR is 2.0-3.0.  The next INR is due 12/21/2010.  Anticoagulation instructions were given to patient.  Results were reviewed/authorized by Louann Sjogren, PharmD.  She was notified by Louann Sjogren PharmD.         Prior Anticoagulation Instructions: INR 2.1  Continue same dose of 1 tablet every day except 1/2 tablet on Monday, Wednesday and Friday.  Recheck INR in 4 weeks.   Current Anticoagulation Instructions: INR 1.9 (goal 2-3) Continue taking 1 tablet everyday except take 1/2 tablet on Mondays, Wednesdays, and Fridays. Return to clinic on February 6th.

## 2010-12-17 NOTE — Medication Information (Signed)
Summary: rov coumadin - [facility]  Anticoagulant Therapy  Managed by: Weston Brass, PharmD Referring MD: Lewayne Bunting, MD PCP: Shana Chute, Md Supervising MD: Daleen Squibb MD, Maisie Fus Indication 1: Atrial Fibrillation Lab Used: [facility] [place] Site: [address] INR POC 2.1 INR RANGE 2 - 3  Dietary changes: no    Health status changes: no    Bleeding/hemorrhagic complications: no    Recent/future hospitalizations: no    Any changes in medication regimen? no    Recent/future dental: no  Any missed doses?: no       Is patient compliant with meds? yes       Allergies: No Known Drug Allergies  Anticoagulation Management History:      The patient is taking warfarin and comes in today for a routine follow up visit.  Positive risk factors for bleeding include an age of 75 years or older.  The bleeding index is 'intermediate risk'.  Positive CHADS2 values include History of HTN and Age > 59 years old.  Anticoagulation responsible provider: Daleen Squibb MD, Maisie Fus.  INR POC: 2.1.  Cuvette Lot#: [id number].  Exp: 12/2011.    Anticoagulation Management Assessment/Plan:      The patient's current anticoagulation dose is Coumadin 3 mg tabs: one daily as directed per coumadin clinic.  The target INR is 2.0-3.0.  The next INR is due 12/01/2010.  Anticoagulation instructions were given to patient.  Results were reviewed/authorized by Weston Brass, PharmD.  She was notified by Weston Brass PharmD.         Prior Anticoagulation Instructions: INR 1.8  Couamdin 3mg  tabs, Take 1 whole tab today then 1 tab TUE, THUR, SAT, SUN and 1/2 tab on MON, WED, FRI  Current Anticoagulation Instructions: INR 2.1  Continue same dose of 1 tablet every day except 1/2 tablet on Monday, Wednesday and Friday.  Recheck INR in 4 weeks.

## 2010-12-21 ENCOUNTER — Encounter: Payer: Self-pay | Admitting: Internal Medicine

## 2010-12-21 ENCOUNTER — Encounter: Payer: Self-pay | Admitting: Cardiology

## 2010-12-21 ENCOUNTER — Encounter (INDEPENDENT_AMBULATORY_CARE_PROVIDER_SITE_OTHER): Payer: Medicare Other

## 2010-12-21 DIAGNOSIS — I4891 Unspecified atrial fibrillation: Secondary | ICD-10-CM

## 2010-12-21 DIAGNOSIS — I495 Sick sinus syndrome: Secondary | ICD-10-CM

## 2010-12-21 DIAGNOSIS — Z7901 Long term (current) use of anticoagulants: Secondary | ICD-10-CM

## 2010-12-23 NOTE — Cardiovascular Report (Signed)
Summary: TTM   TTM   Imported By: Roderic Ovens 12/15/2010 12:00:58  _____________________________________________________________________  External Attachment:    Type:   Image     Comment:   External Document

## 2010-12-29 DIAGNOSIS — I4891 Unspecified atrial fibrillation: Secondary | ICD-10-CM

## 2010-12-31 NOTE — Medication Information (Signed)
Summary: Coumadin Clinic  Anticoagulant Therapy  Managed by: Bethena Midget, RN, BSN Referring MD: Lewayne Bunting, MD PCP: Jess Barters Supervising MD: Jens Som MD, Arlys John Indication 1: Atrial Fibrillation Lab Used: [facility] [place] Site: [address] INR POC 2.3 INR RANGE 2 - 3  Dietary changes: no    Health status changes: no    Bleeding/hemorrhagic complications: no    Recent/future hospitalizations: no    Any changes in medication regimen? no    Recent/future dental: no  Any missed doses?: no       Is patient compliant with meds? yes       Allergies: No Known Drug Allergies  Anticoagulation Management History:      The patient is taking warfarin and comes in today for a routine follow up visit.  Positive risk factors for bleeding include an age of 47 years or older.  The bleeding index is 'intermediate risk'.  Positive CHADS2 values include History of HTN and Age > 75 years old.  Her last INR was 1.9.  Anticoagulation responsible provider: Jens Som MD, Arlys John.  INR POC: 2.3.  Cuvette Lot#: [id number].  Exp: 11/2011.    Anticoagulation Management Assessment/Plan:      The patient's current anticoagulation dose is Coumadin 3 mg tabs: one daily as directed per coumadin clinic.  The target INR is 2.0-3.0.  The next INR is due 01/18/2011.  Anticoagulation instructions were given to patient.  Results were reviewed/authorized by Bethena Midget, RN, BSN.  She was notified by Bethena Midget, RN, BSN.         Prior Anticoagulation Instructions: INR 1.9 (goal 2-3) Continue taking 1 tablet everyday except take 1/2 tablet on Mondays, Wednesdays, and Fridays. Return to clinic on February 6th.  Current Anticoagulation Instructions: INR 2.3 Continue 1 pill everyday except 1/2 pill on Mondays, Wednesdays and Fridays. Recheck in 4 weeks.

## 2010-12-31 NOTE — Cardiovascular Report (Signed)
Summary: Office Visit   Office Visit Remote   Imported By: Roderic Ovens 12/24/2010 16:06:00  _____________________________________________________________________  External Attachment:    Type:   Image     Comment:   External Document

## 2011-01-06 NOTE — Assessment & Plan Note (Signed)
Summary: pacer check/st. jude [facility]   Current Medications (verified): 1)  Lumigan 0.03 % Soln (Bimatoprost) .Marland Kitchen.. 1 Drop Both Eyes Daily 2)  Multivitamins   Tabs (Multiple Vitamin) .Marland Kitchen.. 1 By Mouth Once Daily 3)  Dorzolamide Hcl 2 % Soln (Dorzolamide Hcl) .Marland Kitchen.. 1 Drop Both Eyes Two Times A Day 4)  Exforge 10-160 Mg Tabs (Amlodipine Besylate-Valsartan) .Marland Kitchen.. 1 By Mouth Once Daily 5)  Coumadin 3 Mg Tabs (Warfarin Sodium) .... One Daily As Directed Per Coumadin Clinic 6)  Maxzide-25 37.5-25 Mg Tabs (Triamterene-Hctz) .... One By Mouth Once Daily  Allergies (verified): No Known Drug Allergies   PPM Specifications Following MD:  Lewayne Bunting, MD     PPM Vendor:  [employer] Jude     PPM Model Number:  [id number]     PPM Serial Number:  [id number] PPM DOI:  05/09/2008     PPM Implanting MD:  Lewayne Bunting, MD  Lead 1    Location: RA     DOI: 05/09/2008     Model #: 1699TC     Serial #: [id number]     Status: active Lead 2    Location: RV     DOI: 05/09/2008     Model #: 1788TC     Serial #: [id number]     Status: active   Indications:  BRADYCARDIA   PPM Follow Up Battery Voltage:  2.75 V     Battery Est. Longevity:  4-5.25 yrs     Pacer Dependent:  No       PPM Device Measurements Atrium  Amplitude: 4.0 mV, Impedance: 356 ohms, Threshold: 0.75 V at 0.4 msec Right Ventricle  Amplitude: 8.2 mV, Impedance: 399 ohms, Threshold: 1.0 V at 0.4 msec  Episodes MS Episodes:  1     Percent Mode Switch:  <1%     Coumadin:  Yes Ventricular High Rate:  0     Atrial Pacing:  92%     Ventricular Pacing:  <1%  Parameters Mode:  DDDR     Lower Rate Limit:  60     Upper Rate Limit:  100 Paced AV Delay:  250     Sensed AV Delay:  250 Next Cardiology Appt Due:  06/16/2011 Tech Comments:  1 AMS EPISODE LASTING 1 MIN 24 SECONDS.  NORMAL DEVICE FUNCTION.  CHANGED RV OUTPUT FROM 1.00 TO 1.250 V. CHANGED SLOPE TO 16 AND RECOVERY TIME FROM MEDIUM TO FAST. ROV IN 6 MTHS W/GT. Vella Kohler  December 21, 2010 11:03 AM

## 2011-01-18 ENCOUNTER — Encounter (INDEPENDENT_AMBULATORY_CARE_PROVIDER_SITE_OTHER): Payer: Medicare Other

## 2011-01-18 ENCOUNTER — Encounter: Payer: Self-pay | Admitting: Cardiology

## 2011-01-18 DIAGNOSIS — I4891 Unspecified atrial fibrillation: Secondary | ICD-10-CM

## 2011-01-18 DIAGNOSIS — Z7901 Long term (current) use of anticoagulants: Secondary | ICD-10-CM

## 2011-01-26 NOTE — Medication Information (Signed)
Summary: rov/tm  Anticoagulant Therapy  Managed by: Cloyde Reams, RN, BSN Referring MD: Lewayne Bunting, MD PCP: Shana Chute, Md Supervising MD: Daleen Squibb MD, Maisie Fus Indication 1: Atrial Fibrillation Lab Used: [facility] [place] Site: [address] INR POC 2.5 INR RANGE 2 - 3  Dietary changes: no    Health status changes: no    Bleeding/hemorrhagic complications: no    Recent/future hospitalizations: no    Any changes in medication regimen? no    Recent/future dental: no  Any missed doses?: no       Is patient compliant with meds? yes       Allergies: No Known Drug Allergies  Anticoagulation Management History:      The patient is taking warfarin and comes in today for a routine follow up visit.  Positive risk factors for bleeding include an age of 75 years or older.  The bleeding index is 'intermediate risk'.  Positive CHADS2 values include History of HTN and Age > 49 years old.  Her last INR was 1.9.  Anticoagulation responsible provider: Daleen Squibb MD, Maisie Fus.  INR POC: 2.5.  Cuvette Lot#: [id number].  Exp: 11/2011.    Anticoagulation Management Assessment/Plan:      The patient's current anticoagulation dose is Coumadin 3 mg tabs: one daily as directed per coumadin clinic.  The target INR is 2.0-3.0.  The next INR is due 02/15/2011.  Anticoagulation instructions were given to patient.  Results were reviewed/authorized by Cloyde Reams, RN, BSN.  She was notified by Cloyde Reams RN.         Prior Anticoagulation Instructions: INR 2.3 Continue 1 pill everyday except 1/2 pill on Mondays, Wednesdays and Fridays. Recheck in 4 weeks.   Current Anticoagulation Instructions: INR 2.5  Continue on same dosage 1 tablet daily except 1/2 tablet on Mondays, Wednesdays, and Fridays.  Recheck in 4 weeks.

## 2011-02-15 ENCOUNTER — Ambulatory Visit (INDEPENDENT_AMBULATORY_CARE_PROVIDER_SITE_OTHER): Payer: Medicare Other | Admitting: *Deleted

## 2011-02-15 DIAGNOSIS — I4891 Unspecified atrial fibrillation: Secondary | ICD-10-CM

## 2011-02-15 DIAGNOSIS — Z7901 Long term (current) use of anticoagulants: Secondary | ICD-10-CM | POA: Insufficient documentation

## 2011-02-15 LAB — POCT INR: INR: 2.6

## 2011-02-15 NOTE — Patient Instructions (Signed)
Continue on same dosage 1 tablet daily except 1/2 tablet on Mondays, Wednesdays, and Fridays.   Recheck in 4 weeks.  

## 2011-03-04 ENCOUNTER — Encounter: Payer: Self-pay | Admitting: Internal Medicine

## 2011-03-04 DIAGNOSIS — I498 Other specified cardiac arrhythmias: Secondary | ICD-10-CM

## 2011-03-12 ENCOUNTER — Other Ambulatory Visit: Payer: Self-pay | Admitting: *Deleted

## 2011-03-12 MED ORDER — TRIAMTERENE-HCTZ 37.5-25 MG PO TABS: 1.0000 | ORAL_TABLET | Freq: Every day | ORAL | Status: DC

## 2011-03-15 ENCOUNTER — Ambulatory Visit (INDEPENDENT_AMBULATORY_CARE_PROVIDER_SITE_OTHER): Payer: Medicare Other | Admitting: *Deleted

## 2011-03-15 DIAGNOSIS — I4891 Unspecified atrial fibrillation: Secondary | ICD-10-CM

## 2011-03-15 LAB — POCT INR: INR: 2.8

## 2011-03-30 NOTE — Op Note (Signed)
NAME:  [name] [name], BUEHRING NO.:  [id number]   MEDICAL RECORD NO.:  [id number]          PATIENT TYPE:  INP   LOCATION:  3707                         FACILITY:  [facility]   PHYSICIAN:  Doylene Canning. Ladona Ridgel, MD    DATE OF BIRTH:  [birth year]   DATE OF PROCEDURE:  DATE OF DISCHARGE:                               OPERATIVE REPORT   PROCEDURE PERFORMED:  Implantation of dual-chamber pacemaker.   INDICATIONS:  Symptomatic bradycardia.   The patient 77 year old woman with a history of syncope and bradycardia  who is admitted to hospital for evaluation.  She had pauses up to 3  seconds and bradycardia with heart rates in the 30s and 40s and is now  referred for permanent pacemaker insertion.   PROCEDURE:  After informed consent was obtained, the patient was taken  to diagnostic EP lab in fasting state.  After usual preparation and  draping, intravenous fentanyl and midazolam was given for sedation.  Lidocaine 30 mL was infiltrated into the left infraclavicular region.  A  5-cm incision was carried out of this region and electrocautery was  utilized to dissect down to the fascial plane.  Attempts to cannulate  the left subclavian vein were initially unsuccessful.  At this point,  the cephalic vein was cut down.  It was very very small however it was  ultimately cannulated and a slick wire advanced into the vein and on  into the right atrium.  The 9-French peel-away sheath was advanced along  with the [employer]. Jude model 1788T, 52-cm lead into the which was advanced in  the right atrium in the same way the St. Jude model 16 99T, 46-cm lead  was advanced into the right atrium by way of the cephalic vein.  Mapping  was carried out in the right ventricle at the final site.  The R-waves  measured 20 mV, the pacing threshold 0.7 volts at 0.5 milliseconds.  10  volts pacing not stimulate the diaphragm.  With the ventricular lead in  satisfactory position, attention then turned to placement atrial  lead  was placed in the anterolateral portion of the right atrium where P-  waves were 4, the pacing threshold 0.5 a 0.5 and the pacing impedance  574 ohms.  10 volts pacing did not stimulate the diaphragm.  With the  atrial and ventricular leads in satisfactory position, there was secured  to subpectoralis fascia with a figure-of-eight silk suture.  The sewing  sleeve was also secured with silk suture.  Electrocautery utilized to  make subcutaneous pocket.  Kanamycin irrigation was utilized to irrigate  the pocket.  Electrocautery was utilized to assure hemostasis.  The [employer]  Jude zephyr TR Dual-chamber pacemaker serial number [id number] was  connected to the atrial and ventricular leads.  Then placed back in the  subcutaneous pocket were the generator was secured with silk suture.  Additional kanamycin was utilized to irrigate the pocket.  The incision  closed 2-0 Vicryl followed by layer of 3-0 Vicryl.  Benzoin was painted  on the skin, Steri-Strips were applied, pressures dressing was placed  and the patient was returned to her room in satisfactory condition.   COMPLICATIONS:  There were no immediate procedure complications.   RESULTS:  Demonstrate successful implantation of a [employer] Jude dual-chamber  pacemaker in a patient with symptomatic bradycardia.      Doylene Canning. Ladona Ridgel, MD  Electronically Signed     GWT/MEDQ  D:  05/09/2008  T:  05/10/2008  Job:  [id number]

## 2011-03-30 NOTE — Assessment & Plan Note (Signed)
[facility]                         ELECTROPHYSIOLOGY OFFICE NOTE   NAME:[name], [name] BONHAM                          MRN:          [id number]  DATE:08/27/2008                            DOB:          09/25/1921    HISTORY OF PRESENT ILLNESS:  [name] [name] returns today for followup.  She  is a very pleasant 77 year old woman with a history of syncope and  documented pauses and bradycardia with heart rates in the 30s.  She  underwent permanent pacemaker in June.  She returns today for followup.  The patient has had no recurrent syncope.  She does admit to some  dietary indiscretion with sodium.  Her blood pressure is up today.  She  said she did not take her medicines yet this morning.   CURRENT MEDICATIONS:  1. Lisinopril/HCTZ 20/12.5 a day.  2. Amlodipine 10 a day.  3. Metoprolol 50 a day.   PHYSICAL EXAMINATION:  GENERAL:  She is a pleasant well-appearing  elderly woman, in no distress.  VITAL SIGNS:  Blood pressure was 190/80, the pulse 76 and regular,  respirations were 18.  The weight 124 pounds,  NECK:  Revealed no jugular venous distention.  LUNGS:  Clear bilaterally to auscultation.  No wheezes, rales, or  rhonchi are present.  CARDIOVASCULAR:  Revealed regular rate and rhythm.  Normal S1 and S2.  There is soft S4 gallop.  EXTREMITIES:  Demonstrated trace to 1+ peripheral edema bilaterally.   Interrogation of her pacemaker demonstrates a Engineer, structural.  The P-  waves are 2, the R-waves 10, the impedance 381 in A pole, 34 in the V  the threshold 0.75 at 0.4 in the A and 0.625 at 0.4 in the V.  The  battery voltage was 2.79 volts.  There are 26 mode switches less than 1%  of the time, all less than 2 minutes in duration.  She was 97% A paced.  Today, we turned her outputs down to 2.4 in the right atrium and  switched her to the DDDR mode.   IMPRESSION:  1. Symptomatic bradycardia.  2. Syncope secondary to Symptomatic bradycardia.  3. Status  post pacemaker insertion.  4. Hypertension, now well-controlled.   DISCUSSION:  [name] [name] is stable.  I have again encouraged her to stay  away from salty foods.  I have also asked that she go home and take her  blood pressure medications immediately and  to call her primary physician if her blood pressure remains elevated.  I  will see the patient back for pacemaker followup in 9 months.     Doylene Canning. Ladona Ridgel, MD  Electronically Signed    GWT/MedQ  DD: 08/27/2008  DT: 08/27/2008  Job #: [id number]

## 2011-04-02 NOTE — Discharge Summary (Signed)
NAME:  [name] [name], [name] [name]                   ACCOUNT NO.:  [id number]   MEDICAL RECORD NO.:  [id number]          PATIENT TYPE:  INP   LOCATION:  3707                         FACILITY:  [facility]   PHYSICIAN:  Osvaldo Shipper. Spruill, M.D.DATE OF BIRTH:  27-May-1921   DATE OF ADMISSION:  05/08/2008  DATE OF DISCHARGE:  05/10/2008                               DISCHARGE SUMMARY   DISCHARGE DIAGNOSES:  1. Cardiac dysrhythmia.  2. Hypertension   CONSULTANT:  Doylene Canning. Ladona Ridgel, MD   PROCEDURE:  Insertion of transvenous leads atrium and ventricles as well  as insertion of a dual-chamber pacemaker device on May 09, 2008.   [name] [name] is an 77 year old patient who presents to the Emergency  Department of the [facility] with a complaint of blackouts.   [name] [name] has a history of hypertension and presented to the [facility]  Emergency Department after 4 syncopal episodes on the day of admission.  During one of her episodes, she was sitting at the kitchen table when  the syncopal episode occurred, no falls or head injury reported.  On  evaluation, the heart rate was between 44 and 65.  Electrocardiogram  initially revealed severe bradycardia of 42.   The patient was subsequently admitted for syncope and bradydysrhythmia.   The patient was seen by the electrophysiology team from [facility]  Cardiology.  It was their opinion that the patient indeed had sinus  bradycardia with narrow complex QRS.  The patient was seen to have 2.8-  second pauses on telemetry strips and plans were made to proceed for  workup concerning this.  The patient had a CT scan of the brain, which  revealed no acute process.  Serial cardiac enzymes were negative for  acute cardiac event.   After review of all the information, it was the opinion that the patient  would benefit from a pacemaker.  This procedure including risk and  benefits were discussed with the patient by Cardiology.  The patient and  family agreed to proceed.   On  May 09, 2008, the patient had a DDPM mode defibrillator inserted.  This was then interrogated and found to be working in good working  order.   The patient did not have any additional syncopal episodes or brady  episodes following this, and on May 10, 2008, it was the opinion that  the patient could be discharged home having received maximum benefit  from this hospitalization.  The patient is to see Dr. Ladona Ridgel on August 27, 2008, at 9:20 a.m. in the office.  The patient is to be seen in our  office in 2 weeks unless any changes, problems, or concerns.   Medications at discharge include Lopressor 50 mg one daily, Norvasc 10  mg daily, and Zestoretic 40/12.5 daily.   The patient will notify the physician immediately if any changes,  problems, or concerns.       [name] [name], P.A.      Osvaldo Shipper. Spruill, M.D.  Electronically Signed    HB/MEDQ  D:  06/13/2008  T:  06/13/2008  Job:  [id number]

## 2011-04-14 ENCOUNTER — Ambulatory Visit (INDEPENDENT_AMBULATORY_CARE_PROVIDER_SITE_OTHER): Payer: Medicare Other | Admitting: *Deleted

## 2011-04-14 DIAGNOSIS — I4891 Unspecified atrial fibrillation: Secondary | ICD-10-CM

## 2011-05-12 ENCOUNTER — Ambulatory Visit (INDEPENDENT_AMBULATORY_CARE_PROVIDER_SITE_OTHER): Payer: Medicare Other | Admitting: *Deleted

## 2011-05-12 DIAGNOSIS — I4891 Unspecified atrial fibrillation: Secondary | ICD-10-CM

## 2011-05-12 LAB — POCT INR: INR: 2.3

## 2011-05-26 ENCOUNTER — Encounter: Payer: Self-pay | Admitting: Internal Medicine

## 2011-06-03 ENCOUNTER — Encounter: Payer: Self-pay | Admitting: Internal Medicine

## 2011-06-03 DIAGNOSIS — I498 Other specified cardiac arrhythmias: Secondary | ICD-10-CM

## 2011-06-09 ENCOUNTER — Ambulatory Visit (INDEPENDENT_AMBULATORY_CARE_PROVIDER_SITE_OTHER): Payer: Medicare Other | Admitting: *Deleted

## 2011-06-09 DIAGNOSIS — I4891 Unspecified atrial fibrillation: Secondary | ICD-10-CM

## 2011-06-09 LAB — POCT INR: INR: 2.1

## 2011-06-29 ENCOUNTER — Encounter: Payer: Self-pay | Admitting: Internal Medicine

## 2011-06-29 ENCOUNTER — Ambulatory Visit (INDEPENDENT_AMBULATORY_CARE_PROVIDER_SITE_OTHER): Payer: Medicare Other | Admitting: Internal Medicine

## 2011-06-29 ENCOUNTER — Ambulatory Visit (INDEPENDENT_AMBULATORY_CARE_PROVIDER_SITE_OTHER): Payer: Medicare Other | Admitting: *Deleted

## 2011-06-29 DIAGNOSIS — I4891 Unspecified atrial fibrillation: Secondary | ICD-10-CM

## 2011-06-29 DIAGNOSIS — I495 Sick sinus syndrome: Secondary | ICD-10-CM

## 2011-06-29 DIAGNOSIS — Z950 Presence of cardiac pacemaker: Secondary | ICD-10-CM

## 2011-06-29 DIAGNOSIS — I10 Essential (primary) hypertension: Secondary | ICD-10-CM

## 2011-06-29 LAB — POCT INR: INR: 2.5

## 2011-06-29 NOTE — Assessment & Plan Note (Signed)
Mostly, she is maintaining normal sinus rhythm. She will continue her current medical therapy.

## 2011-06-29 NOTE — Progress Notes (Signed)
HPI [name] [name] returns today for followup. She is a pleasant 77 year old woman with a history of symptomatic bradycardia, paroxysmal atrial fibrillation, hypertension, and mild peripheral edema. She denies chest pain or shortness of breath. No weight loss. She admits to some sodium indiscretion and peripheral edema. No syncope. No Known Allergies   Current Outpatient Prescriptions  Medication Sig Dispense Refill  . amLODipine-valsartan (EXFORGE) 10-160 MG per tablet Take 1 tablet by mouth daily.        . bimatoprost (LUMIGAN) 0.03 % ophthalmic solution Place 1 drop into both eyes daily.        . dorzolamide (TRUSOPT) 2 % ophthalmic solution Place 1 drop into both eyes 2 (two) times daily.        . Multiple Vitamins-Minerals (MULTIVITAL) tablet Take 1 tablet by mouth daily.        Marland Kitchen triamterene-hydrochlorothiazide (MAXZIDE-25) 37.5-25 MG per tablet Take 1 tablet by mouth daily.  30 tablet  11  . warfarin (COUMADIN) 3 MG tablet Take by mouth as directed.           Past Medical History  Diagnosis Date  . Cardiac dysrhythmia, unspecified   . HTN (hypertension)   . Cardiac pacemaker in situ   . Syncope   . Bradycardia     ROS:   All systems reviewed and negative except as noted in the HPI.   Past Surgical History  Procedure Date  . Implantation of dual chamber pacemaker 05/09/08    St Jude dual-chamber. Dr. Ladona Ridgel      No family history on file.   History   Social History  . Marital Status: Married    Spouse Name: N/A    Number of Children: N/A  . Years of Education: N/A   Occupational History  . Not on file.   Social History Main Topics  . Smoking status: Never Smoker   . Smokeless tobacco: Not on file  . Alcohol Use: No  . Drug Use: No  . Sexually Active: Not on file   Other Topics Concern  . Not on file   Social History Narrative  . No narrative on file     BP 128/71  Pulse 60  Resp 12  Ht 5' (1.524 m)  Wt 122 lb (55.339 kg)  BMI 23.83  kg/m2  Physical Exam:  Well appearing elderly woman,NAD HEENT: Unremarkable Neck:  No JVD, no thyromegally Lymphatics:  No adenopathy Back:  No CVA tenderness Lungs:  Clear with minimal rales in the bases. No wheezes or rhonchi. Well-healed pacemaker incision. HEART:  Regular rate rhythm, no murmurs, no rubs, no clicks Abd:  soft, positive bowel sounds, no organomegally, no rebound, no guarding Ext:  2 plus pulses, no edema, no cyanosis, no clubbing Skin:  No rashes no nodules Neuro:  CN II through XII intact, motor grossly intact  DEVICE  Normal device function.  See PaceArt for details.   Assess/Plan:

## 2011-06-29 NOTE — Patient Instructions (Signed)
Your physician wants you to follow-up in: 12 months with Dr. Taylor. You will receive a reminder letter in the mail two months in advance. If you don't receive a letter, please call our office to schedule the follow-up appointment.    

## 2011-06-29 NOTE — Assessment & Plan Note (Signed)
Blood pressure is well controlled. She will continue her current medical therapy and have asked her to maintain a low-sodium diet.

## 2011-06-29 NOTE — Assessment & Plan Note (Signed)
Her device is working normally. We'll plan to recheck in several months. 

## 2011-07-01 ENCOUNTER — Other Ambulatory Visit: Payer: Self-pay | Admitting: Internal Medicine

## 2011-07-27 ENCOUNTER — Ambulatory Visit (INDEPENDENT_AMBULATORY_CARE_PROVIDER_SITE_OTHER): Payer: Medicare Other | Admitting: *Deleted

## 2011-07-27 DIAGNOSIS — I4891 Unspecified atrial fibrillation: Secondary | ICD-10-CM

## 2011-08-12 LAB — DIFFERENTIAL
Basophils Relative: 0
Lymphs Abs: 1.1
Monocytes Relative: 11
Neutro Abs: 2.3
Neutrophils Relative %: 60

## 2011-08-12 LAB — POCT I-STAT, CHEM 8
Chloride: 105
Creatinine, Ser: 1
Glucose, Bld: 123 — ABNORMAL HIGH
HCT: 37
Potassium: 3.9

## 2011-08-12 LAB — CARDIAC PANEL(CRET KIN+CKTOT+MB+TROPI)
Relative Index: INVALID
Relative Index: INVALID
Total CK: 99
Troponin I: 0.01
Troponin I: 0.03
Troponin I: 0.04

## 2011-08-12 LAB — POCT CARDIAC MARKERS
CKMB, poc: 1.2
Myoglobin, poc: 47.3
Troponin i, poc: <0.05

## 2011-08-12 LAB — COMPREHENSIVE METABOLIC PANEL
Albumin: 3.7
BUN: 17
Chloride: 108
Creatinine, Ser: 0.78
GFR calc non Af Amer: >60
Glucose, Bld: 120 — ABNORMAL HIGH
Total Bilirubin: 0.9

## 2011-08-12 LAB — URINALYSIS, ROUTINE W REFLEX MICROSCOPIC
Bilirubin Urine: NEGATIVE
Glucose, UA: NEGATIVE
Hgb urine dipstick: NEGATIVE
Specific Gravity, Urine: 1.014
Urobilinogen, UA: 1

## 2011-08-12 LAB — CBC
Hemoglobin: 12
RBC: 3.68 — ABNORMAL LOW
WBC: 3.8 — ABNORMAL LOW

## 2011-08-12 LAB — URINE MICROSCOPIC-ADD ON

## 2011-08-12 LAB — CK TOTAL AND CKMB (NOT AT ARMC): Relative Index: 2.1

## 2011-08-12 LAB — TROPONIN I: Troponin I: 0.02

## 2011-08-12 LAB — PROTIME-INR
INR: 1.1
Prothrombin Time: 13.9

## 2011-08-24 ENCOUNTER — Ambulatory Visit (INDEPENDENT_AMBULATORY_CARE_PROVIDER_SITE_OTHER): Payer: Medicare Other | Admitting: *Deleted

## 2011-08-24 DIAGNOSIS — I4891 Unspecified atrial fibrillation: Secondary | ICD-10-CM

## 2011-09-02 ENCOUNTER — Encounter: Payer: Self-pay | Admitting: Internal Medicine

## 2011-09-02 DIAGNOSIS — I498 Other specified cardiac arrhythmias: Secondary | ICD-10-CM

## 2011-09-23 ENCOUNTER — Ambulatory Visit (INDEPENDENT_AMBULATORY_CARE_PROVIDER_SITE_OTHER): Payer: Medicare Other | Admitting: *Deleted

## 2011-09-23 DIAGNOSIS — Z7901 Long term (current) use of anticoagulants: Secondary | ICD-10-CM

## 2011-09-23 DIAGNOSIS — I4891 Unspecified atrial fibrillation: Secondary | ICD-10-CM

## 2011-11-04 ENCOUNTER — Ambulatory Visit (INDEPENDENT_AMBULATORY_CARE_PROVIDER_SITE_OTHER): Payer: Medicare Other | Admitting: *Deleted

## 2011-11-04 DIAGNOSIS — I4891 Unspecified atrial fibrillation: Secondary | ICD-10-CM

## 2011-12-02 ENCOUNTER — Encounter: Payer: Self-pay | Admitting: Internal Medicine

## 2011-12-02 DIAGNOSIS — I498 Other specified cardiac arrhythmias: Secondary | ICD-10-CM

## 2011-12-07 ENCOUNTER — Other Ambulatory Visit: Payer: Self-pay | Admitting: Internal Medicine

## 2011-12-16 ENCOUNTER — Ambulatory Visit (INDEPENDENT_AMBULATORY_CARE_PROVIDER_SITE_OTHER): Payer: Medicare Other | Admitting: *Deleted

## 2011-12-16 DIAGNOSIS — Z7901 Long term (current) use of anticoagulants: Secondary | ICD-10-CM

## 2011-12-16 DIAGNOSIS — I4891 Unspecified atrial fibrillation: Secondary | ICD-10-CM

## 2012-01-27 ENCOUNTER — Ambulatory Visit (INDEPENDENT_AMBULATORY_CARE_PROVIDER_SITE_OTHER): Payer: Medicare Other

## 2012-01-27 DIAGNOSIS — I4891 Unspecified atrial fibrillation: Secondary | ICD-10-CM

## 2012-01-27 DIAGNOSIS — Z7901 Long term (current) use of anticoagulants: Secondary | ICD-10-CM

## 2012-01-27 LAB — POCT INR: INR: 4

## 2012-02-09 ENCOUNTER — Encounter: Payer: Self-pay | Admitting: Internal Medicine

## 2012-02-17 ENCOUNTER — Ambulatory Visit (INDEPENDENT_AMBULATORY_CARE_PROVIDER_SITE_OTHER): Payer: Medicare Other | Admitting: *Deleted

## 2012-02-17 DIAGNOSIS — Z7901 Long term (current) use of anticoagulants: Secondary | ICD-10-CM

## 2012-02-17 DIAGNOSIS — I4891 Unspecified atrial fibrillation: Secondary | ICD-10-CM

## 2012-02-17 LAB — POCT INR: INR: 2.2

## 2012-03-02 ENCOUNTER — Encounter: Payer: Self-pay | Admitting: Internal Medicine

## 2012-03-02 DIAGNOSIS — I498 Other specified cardiac arrhythmias: Secondary | ICD-10-CM

## 2012-03-09 ENCOUNTER — Ambulatory Visit (INDEPENDENT_AMBULATORY_CARE_PROVIDER_SITE_OTHER): Payer: Medicare Other

## 2012-03-09 DIAGNOSIS — Z7901 Long term (current) use of anticoagulants: Secondary | ICD-10-CM

## 2012-03-09 DIAGNOSIS — I4891 Unspecified atrial fibrillation: Secondary | ICD-10-CM

## 2012-03-09 LAB — POCT INR: INR: 2.5

## 2012-03-22 ENCOUNTER — Other Ambulatory Visit: Payer: Self-pay | Admitting: Internal Medicine

## 2012-04-06 ENCOUNTER — Ambulatory Visit (INDEPENDENT_AMBULATORY_CARE_PROVIDER_SITE_OTHER): Payer: Medicare Other | Admitting: Pharmacist

## 2012-04-06 DIAGNOSIS — Z7901 Long term (current) use of anticoagulants: Secondary | ICD-10-CM

## 2012-04-06 DIAGNOSIS — I4891 Unspecified atrial fibrillation: Secondary | ICD-10-CM

## 2012-04-06 LAB — POCT INR: INR: 1.9

## 2012-04-27 ENCOUNTER — Ambulatory Visit (INDEPENDENT_AMBULATORY_CARE_PROVIDER_SITE_OTHER): Payer: Medicare Other | Admitting: *Deleted

## 2012-04-27 DIAGNOSIS — I4891 Unspecified atrial fibrillation: Secondary | ICD-10-CM

## 2012-04-27 DIAGNOSIS — Z7901 Long term (current) use of anticoagulants: Secondary | ICD-10-CM

## 2012-04-27 LAB — POCT INR: INR: 2

## 2012-05-23 ENCOUNTER — Other Ambulatory Visit: Payer: Self-pay | Admitting: Internal Medicine

## 2012-05-24 ENCOUNTER — Ambulatory Visit (INDEPENDENT_AMBULATORY_CARE_PROVIDER_SITE_OTHER): Payer: Medicare Other | Admitting: Pharmacist

## 2012-05-24 DIAGNOSIS — I4891 Unspecified atrial fibrillation: Secondary | ICD-10-CM

## 2012-05-24 DIAGNOSIS — Z7901 Long term (current) use of anticoagulants: Secondary | ICD-10-CM

## 2012-05-24 LAB — POCT INR: INR: 2.2

## 2012-06-01 DIAGNOSIS — I498 Other specified cardiac arrhythmias: Secondary | ICD-10-CM

## 2012-06-21 ENCOUNTER — Ambulatory Visit (INDEPENDENT_AMBULATORY_CARE_PROVIDER_SITE_OTHER): Payer: Medicare Other | Admitting: Pharmacist

## 2012-06-21 DIAGNOSIS — I4891 Unspecified atrial fibrillation: Secondary | ICD-10-CM

## 2012-06-21 DIAGNOSIS — Z7901 Long term (current) use of anticoagulants: Secondary | ICD-10-CM

## 2012-06-22 ENCOUNTER — Other Ambulatory Visit: Payer: Self-pay | Admitting: Cardiology

## 2012-06-22 MED ORDER — TRIAMTERENE-HCTZ 37.5-25 MG PO TABS: 1.0000 | ORAL_TABLET | Freq: Every day | ORAL | Status: DC

## 2012-07-04 ENCOUNTER — Encounter: Payer: Self-pay | Admitting: Internal Medicine

## 2012-07-05 ENCOUNTER — Encounter: Payer: Self-pay | Admitting: Internal Medicine

## 2012-07-05 ENCOUNTER — Ambulatory Visit (INDEPENDENT_AMBULATORY_CARE_PROVIDER_SITE_OTHER): Payer: Medicare Other | Admitting: Internal Medicine

## 2012-07-05 VITALS — BP 136/78 | HR 72

## 2012-07-05 DIAGNOSIS — I4891 Unspecified atrial fibrillation: Secondary | ICD-10-CM

## 2012-07-05 DIAGNOSIS — Z7901 Long term (current) use of anticoagulants: Secondary | ICD-10-CM

## 2012-07-05 DIAGNOSIS — Z950 Presence of cardiac pacemaker: Secondary | ICD-10-CM

## 2012-07-05 LAB — PACEMAKER DEVICE OBSERVATION
AL AMPLITUDE: 3.2 mv
ATRIAL PACING PM: 97
BAMS-0001: 150 {beats}/min
BATTERY VOLTAGE: 2.78 V
RV LEAD IMPEDENCE PM: 385 Ohm
VENTRICULAR PACING PM: 1

## 2012-07-05 NOTE — Assessment & Plan Note (Signed)
She appears to be tolerating warfarin. No change in medical therapy today.

## 2012-07-05 NOTE — Assessment & Plan Note (Signed)
Her St. Jude dual-chamber pacemaker is working normally. We'll plan to recheck in several months. 

## 2012-07-05 NOTE — Progress Notes (Signed)
HPI [name] [name] returns today for followup. She is a very pleasant 78 year old woman with a history of symptomatic bradycardia, hypertension, status post permanent pacemaker insertion. She is paroxysmal atrial fibrillation and is anticoagulated with warfarin. She denies falls, chest pain, shortness of breath, but does have very mild peripheral edema. No syncope. She still cares for her 85 year old husband and they live independently at home. No Known Allergies   Current Outpatient Prescriptions  Medication Sig Dispense Refill  . amLODipine-valsartan (EXFORGE) 10-160 MG per tablet Take 1 tablet by mouth daily.        . bimatoprost (LUMIGAN) 0.03 % ophthalmic solution Place 1 drop into both eyes daily.        . dorzolamide (TRUSOPT) 2 % ophthalmic solution Place 1 drop into both eyes 2 (two) times daily.        . Multiple Vitamins-Minerals (MULTIVITAL) tablet Take 1 tablet by mouth daily.        Marland Kitchen triamterene-hydrochlorothiazide (MAXZIDE-25) 37.5-25 MG per tablet Take 1 each (1 tablet total) by mouth daily.  30 tablet  0  . warfarin (COUMADIN) 3 MG tablet TAKE 1 TABLET AS DIRECTED BY            ANTICOAGULATION CLINIC.  30 tablet  3     Past Medical History  Diagnosis Date  . Cardiac dysrhythmia, unspecified   . HTN (hypertension)   . Cardiac pacemaker in situ   . Syncope   . Bradycardia     ROS:   All systems reviewed and negative except as noted in the HPI.   Past Surgical History  Procedure Date  . Implantation of dual chamber pacemaker 05/09/08    St Jude dual-chamber. Dr. Ladona Ridgel      No family history on file.   History   Social History  . Marital Status: Married    Spouse Name: N/A    Number of Children: N/A  . Years of Education: N/A   Occupational History  . Not on file.   Social History Main Topics  . Smoking status: Never Smoker   . Smokeless tobacco: Not on file  . Alcohol Use: No  . Drug Use: No  . Sexually Active: Not on file   Other Topics Concern  .  Not on file   Social History Narrative  . No narrative on file     BP 136/78  Pulse 72  Physical Exam:  Well appearing elderly woman, NAD HEENT: Unremarkable Neck:  No JVD, no thyromegally Lungs:  Clear with no wheezes, rales, or rhonchi. HEART:  Regular rate rhythm, no murmurs, no rubs, no clicks Abd:  soft, positive bowel sounds, no organomegally, no rebound, no guarding Ext:  2 plus pulses, no edema, no cyanosis, no clubbing Skin:  No rashes no nodules Neuro:  CN II through XII intact, motor grossly intact  DEVICE  Normal device function.  See PaceArt for details.   Assess/Plan:

## 2012-07-05 NOTE — Assessment & Plan Note (Signed)
She is currently in atrial fibrillation and her ventricular rate is well controlled. She will continue her current medical therapy.

## 2012-07-05 NOTE — Patient Instructions (Addendum)
Your physician wants you to follow-up in: 6 months with device clinic and 12 months with Dr Taylor You will receive a reminder letter in the mail two months in advance. If you don't receive a letter, please call our office to schedule the follow-up appointment.  

## 2012-07-19 ENCOUNTER — Ambulatory Visit (INDEPENDENT_AMBULATORY_CARE_PROVIDER_SITE_OTHER): Payer: Medicare Other | Admitting: *Deleted

## 2012-07-19 DIAGNOSIS — I4891 Unspecified atrial fibrillation: Secondary | ICD-10-CM

## 2012-07-19 DIAGNOSIS — Z7901 Long term (current) use of anticoagulants: Secondary | ICD-10-CM

## 2012-07-19 LAB — POCT INR: INR: 2.6

## 2012-07-25 ENCOUNTER — Other Ambulatory Visit: Payer: Self-pay | Admitting: Cardiology

## 2012-07-25 MED ORDER — TRIAMTERENE-HCTZ 37.5-25 MG PO TABS: 1.0000 | ORAL_TABLET | Freq: Every day | ORAL | Status: DC

## 2012-08-16 ENCOUNTER — Ambulatory Visit (INDEPENDENT_AMBULATORY_CARE_PROVIDER_SITE_OTHER): Payer: Medicare Other | Admitting: *Deleted

## 2012-08-16 DIAGNOSIS — I4891 Unspecified atrial fibrillation: Secondary | ICD-10-CM

## 2012-08-16 DIAGNOSIS — Z7901 Long term (current) use of anticoagulants: Secondary | ICD-10-CM

## 2012-08-16 LAB — POCT INR: INR: 4.5

## 2012-08-30 ENCOUNTER — Ambulatory Visit (INDEPENDENT_AMBULATORY_CARE_PROVIDER_SITE_OTHER): Payer: Medicare Other | Admitting: Pharmacist

## 2012-08-30 DIAGNOSIS — I4891 Unspecified atrial fibrillation: Secondary | ICD-10-CM

## 2012-08-30 DIAGNOSIS — Z7901 Long term (current) use of anticoagulants: Secondary | ICD-10-CM

## 2012-08-30 LAB — POCT INR: INR: 4.6

## 2012-08-31 DIAGNOSIS — I498 Other specified cardiac arrhythmias: Secondary | ICD-10-CM

## 2012-09-12 ENCOUNTER — Ambulatory Visit (INDEPENDENT_AMBULATORY_CARE_PROVIDER_SITE_OTHER): Payer: Medicare Other

## 2012-09-12 DIAGNOSIS — Z7901 Long term (current) use of anticoagulants: Secondary | ICD-10-CM

## 2012-09-12 DIAGNOSIS — I4891 Unspecified atrial fibrillation: Secondary | ICD-10-CM

## 2012-10-03 ENCOUNTER — Ambulatory Visit (INDEPENDENT_AMBULATORY_CARE_PROVIDER_SITE_OTHER): Payer: Medicare Other | Admitting: *Deleted

## 2012-10-03 DIAGNOSIS — Z7901 Long term (current) use of anticoagulants: Secondary | ICD-10-CM

## 2012-10-03 DIAGNOSIS — I4891 Unspecified atrial fibrillation: Secondary | ICD-10-CM

## 2012-10-03 LAB — POCT INR: INR: 2.8

## 2012-10-31 ENCOUNTER — Ambulatory Visit (INDEPENDENT_AMBULATORY_CARE_PROVIDER_SITE_OTHER): Payer: Medicare Other | Admitting: *Deleted

## 2012-10-31 DIAGNOSIS — Z7901 Long term (current) use of anticoagulants: Secondary | ICD-10-CM

## 2012-10-31 DIAGNOSIS — I4891 Unspecified atrial fibrillation: Secondary | ICD-10-CM

## 2012-10-31 LAB — POCT INR: INR: 2.5

## 2012-11-13 ENCOUNTER — Other Ambulatory Visit: Payer: Self-pay | Admitting: Internal Medicine

## 2012-11-30 DIAGNOSIS — I498 Other specified cardiac arrhythmias: Secondary | ICD-10-CM

## 2012-12-12 ENCOUNTER — Ambulatory Visit (INDEPENDENT_AMBULATORY_CARE_PROVIDER_SITE_OTHER): Payer: Medicare PPO | Admitting: *Deleted

## 2012-12-12 DIAGNOSIS — Z7901 Long term (current) use of anticoagulants: Secondary | ICD-10-CM

## 2012-12-12 DIAGNOSIS — I4891 Unspecified atrial fibrillation: Secondary | ICD-10-CM

## 2012-12-19 ENCOUNTER — Other Ambulatory Visit: Payer: Self-pay | Admitting: Internal Medicine

## 2012-12-28 ENCOUNTER — Encounter: Payer: Self-pay | Admitting: Cardiology

## 2012-12-29 ENCOUNTER — Encounter: Payer: Self-pay | Admitting: Cardiology

## 2013-01-11 ENCOUNTER — Encounter: Payer: Self-pay | Admitting: Cardiology

## 2013-01-11 ENCOUNTER — Ambulatory Visit (INDEPENDENT_AMBULATORY_CARE_PROVIDER_SITE_OTHER): Payer: Medicare PPO | Admitting: Cardiology

## 2013-01-11 LAB — PACEMAKER DEVICE OBSERVATION
AL AMPLITUDE: 4 mv
DEVICE MODEL PM: 1961170
RV LEAD IMPEDENCE PM: 384 Ohm
RV LEAD THRESHOLD: 1 V
VENTRICULAR PACING PM: 0

## 2013-01-11 NOTE — Progress Notes (Signed)
6 month PPM check/device clinic only. See PaceArt report.

## 2013-01-11 NOTE — Patient Instructions (Addendum)
Your physician wants you to follow-up in: 6 months with Dr Taylor You will receive a reminder letter in the mail two months in advance. If you don't receive a letter, please call our office to schedule the follow-up appointment.  

## 2013-01-23 ENCOUNTER — Ambulatory Visit (INDEPENDENT_AMBULATORY_CARE_PROVIDER_SITE_OTHER): Payer: Medicare PPO | Admitting: *Deleted

## 2013-01-29 ENCOUNTER — Encounter: Payer: Self-pay | Admitting: Internal Medicine

## 2013-02-08 ENCOUNTER — Ambulatory Visit (INDEPENDENT_AMBULATORY_CARE_PROVIDER_SITE_OTHER): Payer: Medicare PPO | Admitting: *Deleted

## 2013-02-08 DIAGNOSIS — I4891 Unspecified atrial fibrillation: Secondary | ICD-10-CM

## 2013-02-08 DIAGNOSIS — Z7901 Long term (current) use of anticoagulants: Secondary | ICD-10-CM

## 2013-02-08 LAB — POCT INR: INR: 1.9

## 2013-02-22 ENCOUNTER — Ambulatory Visit (INDEPENDENT_AMBULATORY_CARE_PROVIDER_SITE_OTHER): Payer: Medicare PPO | Admitting: *Deleted

## 2013-02-22 DIAGNOSIS — I4891 Unspecified atrial fibrillation: Secondary | ICD-10-CM

## 2013-02-22 DIAGNOSIS — Z7901 Long term (current) use of anticoagulants: Secondary | ICD-10-CM

## 2013-02-22 LAB — POCT INR: INR: 1.9

## 2013-03-01 DIAGNOSIS — I498 Other specified cardiac arrhythmias: Secondary | ICD-10-CM

## 2013-03-15 ENCOUNTER — Ambulatory Visit (INDEPENDENT_AMBULATORY_CARE_PROVIDER_SITE_OTHER): Payer: Medicare PPO | Admitting: *Deleted

## 2013-03-15 DIAGNOSIS — I4891 Unspecified atrial fibrillation: Secondary | ICD-10-CM

## 2013-03-15 DIAGNOSIS — Z7901 Long term (current) use of anticoagulants: Secondary | ICD-10-CM

## 2013-03-15 LAB — POCT INR: INR: 2.3

## 2013-04-12 ENCOUNTER — Ambulatory Visit (INDEPENDENT_AMBULATORY_CARE_PROVIDER_SITE_OTHER): Payer: Medicare PPO | Admitting: *Deleted

## 2013-04-12 DIAGNOSIS — I4891 Unspecified atrial fibrillation: Secondary | ICD-10-CM

## 2013-04-12 DIAGNOSIS — Z7901 Long term (current) use of anticoagulants: Secondary | ICD-10-CM

## 2013-04-12 LAB — POCT INR: INR: 2.5

## 2013-05-04 ENCOUNTER — Other Ambulatory Visit: Payer: Self-pay | Admitting: Pharmacist

## 2013-05-04 MED ORDER — WARFARIN SODIUM 3 MG PO TABS: ORAL_TABLET | ORAL | Status: DC

## 2013-05-10 ENCOUNTER — Ambulatory Visit (INDEPENDENT_AMBULATORY_CARE_PROVIDER_SITE_OTHER): Payer: Medicare PPO | Admitting: *Deleted

## 2013-05-10 DIAGNOSIS — I4891 Unspecified atrial fibrillation: Secondary | ICD-10-CM

## 2013-05-10 DIAGNOSIS — Z7901 Long term (current) use of anticoagulants: Secondary | ICD-10-CM

## 2013-05-10 LAB — POCT INR: INR: 2.8

## 2013-05-11 ENCOUNTER — Other Ambulatory Visit: Payer: Self-pay | Admitting: *Deleted

## 2013-05-11 MED ORDER — WARFARIN SODIUM 3 MG PO TABS: ORAL_TABLET | ORAL | Status: DC

## 2013-05-24 ENCOUNTER — Other Ambulatory Visit: Payer: Self-pay | Admitting: *Deleted

## 2013-05-24 MED ORDER — WARFARIN SODIUM 3 MG PO TABS: ORAL_TABLET | ORAL | Status: DC

## 2013-05-31 DIAGNOSIS — I498 Other specified cardiac arrhythmias: Secondary | ICD-10-CM

## 2013-06-07 ENCOUNTER — Ambulatory Visit (INDEPENDENT_AMBULATORY_CARE_PROVIDER_SITE_OTHER): Payer: Medicare PPO

## 2013-06-07 DIAGNOSIS — Z7901 Long term (current) use of anticoagulants: Secondary | ICD-10-CM

## 2013-06-07 DIAGNOSIS — I4891 Unspecified atrial fibrillation: Secondary | ICD-10-CM

## 2013-06-07 LAB — POCT INR: INR: 2.3

## 2013-07-13 ENCOUNTER — Ambulatory Visit (INDEPENDENT_AMBULATORY_CARE_PROVIDER_SITE_OTHER): Payer: Medicare PPO | Admitting: Internal Medicine

## 2013-07-13 ENCOUNTER — Encounter: Payer: Self-pay | Admitting: Internal Medicine

## 2013-07-13 ENCOUNTER — Ambulatory Visit (INDEPENDENT_AMBULATORY_CARE_PROVIDER_SITE_OTHER): Payer: Medicare PPO

## 2013-07-13 VITALS — BP 172/77 | HR 80 | Ht 61.0 in | Wt 103.8 lb

## 2013-07-13 DIAGNOSIS — Z7901 Long term (current) use of anticoagulants: Secondary | ICD-10-CM

## 2013-07-13 DIAGNOSIS — I499 Cardiac arrhythmia, unspecified: Secondary | ICD-10-CM

## 2013-07-13 DIAGNOSIS — R634 Abnormal weight loss: Secondary | ICD-10-CM

## 2013-07-13 DIAGNOSIS — Z950 Presence of cardiac pacemaker: Secondary | ICD-10-CM

## 2013-07-13 DIAGNOSIS — I4891 Unspecified atrial fibrillation: Secondary | ICD-10-CM

## 2013-07-13 LAB — PACEMAKER DEVICE OBSERVATION
AL AMPLITUDE: 3.3 mv
AL THRESHOLD: 0.75 V
BAMS-0001: 150 {beats}/min
DEVICE MODEL PM: 1961170
RV LEAD THRESHOLD: 1 V
VENTRICULAR PACING PM: 1

## 2013-07-13 NOTE — Assessment & Plan Note (Signed)
Her St. Jude dual-chamber pacemaker is working normally. We'll plan to recheck in several months. 

## 2013-07-13 NOTE — Assessment & Plan Note (Signed)
She is maintaining sinus rhythm 98% of the time. She will continue her systemic anticoagulation for now.

## 2013-07-13 NOTE — Assessment & Plan Note (Signed)
I have asked the patient to liberalize her dietary intake of fat. I discussed this with her daughter. Hopefully this will keep her from losing additional weight.

## 2013-07-13 NOTE — Patient Instructions (Addendum)
Your physician wants you to follow-up in: 12 months with Dr. Taylor. You will receive a reminder letter in the mail two months in advance. If you don't receive a letter, please call our office to schedule the follow-up appointment.    

## 2013-07-13 NOTE — Progress Notes (Signed)
HPI [name] returns today for followup. She is a 79 year old woman with a history of symptomatic bradycardia, status post permanent pacemaker insertion, paroxysmal atrial fibrillation, and hypertension. She remains on Coumadin. In the interim, she has done well except for problems with weight loss. Her appetite is down. No Known Allergies   Current Outpatient Prescriptions  Medication Sig Dispense Refill  . bimatoprost (LUMIGAN) 0.03 % ophthalmic solution Place 1 drop into both eyes daily.        . dorzolamide (TRUSOPT) 2 % ophthalmic solution Place 1 drop into both eyes 2 (two) times daily.        . Multiple Vitamins-Minerals (MULTIVITAL) tablet Take 1 tablet by mouth daily.        Marland Kitchen triamterene-hydrochlorothiazide (MAXZIDE-25) 37.5-25 MG per tablet TAKE ONE (1) TABLET EACH DAY  30 tablet  12  . warfarin (COUMADIN) 3 MG tablet Take as directed by coumadin clinic  30 tablet  3   No current facility-administered medications for this visit.     Past Medical History  Diagnosis Date  . Cardiac dysrhythmia, unspecified   . HTN (hypertension)   . Cardiac pacemaker in situ   . Syncope   . Bradycardia     ROS:   All systems reviewed and negative except as noted in the HPI.   Past Surgical History  Procedure Laterality Date  . Implantation of dual chamber pacemaker  05/09/08    St Jude dual-chamber. Dr. Ladona Ridgel      No family history on file.   History   Social History  . Marital Status: Married    Spouse Name: N/A    Number of Children: N/A  . Years of Education: N/A   Occupational History  . Not on file.   Social History Main Topics  . Smoking status: Never Smoker   . Smokeless tobacco: Not on file  . Alcohol Use: No  . Drug Use: No  . Sexual Activity: Not on file   Other Topics Concern  . Not on file   Social History Narrative  . No narrative on file     BP 172/77  Pulse 80  Ht 5\' 1"  (1.549 m)  Wt 103 lb 12.8 oz (47.083 kg)  BMI 19.62 kg/m2  Physical  Exam:  Well appearing elderly woman,NAD HEENT: Unremarkable Neck:  No JVD, no thyromegally Back:  No CVA tenderness Lungs:  Clear with no wheezes, rales, or rhonchi. HEART:  Regular rate rhythm, no murmurs, no rubs, no clicks Abd:  soft, positive bowel sounds, no organomegally, no rebound, no guarding Ext:  2 plus pulses, no edema, no cyanosis, no clubbing Skin:  No rashes no nodules Neuro:  CN II through XII intact, motor grossly intact  DEVICE  Normal device function.  See PaceArt for details.   Assess/Plan:

## 2013-08-24 ENCOUNTER — Ambulatory Visit (INDEPENDENT_AMBULATORY_CARE_PROVIDER_SITE_OTHER): Payer: Medicare PPO | Admitting: *Deleted

## 2013-08-24 DIAGNOSIS — Z7901 Long term (current) use of anticoagulants: Secondary | ICD-10-CM

## 2013-08-24 DIAGNOSIS — I4891 Unspecified atrial fibrillation: Secondary | ICD-10-CM

## 2013-09-20 ENCOUNTER — Other Ambulatory Visit: Payer: Self-pay | Admitting: *Deleted

## 2013-09-20 MED ORDER — WARFARIN SODIUM 3 MG PO TABS: ORAL_TABLET | ORAL | Status: DC

## 2013-10-05 ENCOUNTER — Encounter (INDEPENDENT_AMBULATORY_CARE_PROVIDER_SITE_OTHER): Payer: Self-pay

## 2013-10-05 ENCOUNTER — Ambulatory Visit (INDEPENDENT_AMBULATORY_CARE_PROVIDER_SITE_OTHER): Payer: Medicare PPO | Admitting: Pharmacist

## 2013-10-05 DIAGNOSIS — I4891 Unspecified atrial fibrillation: Secondary | ICD-10-CM

## 2013-10-05 DIAGNOSIS — Z7901 Long term (current) use of anticoagulants: Secondary | ICD-10-CM

## 2013-10-05 LAB — POCT INR: INR: 3

## 2013-11-13 ENCOUNTER — Encounter: Payer: Self-pay | Admitting: Internal Medicine

## 2013-11-13 DIAGNOSIS — I498 Other specified cardiac arrhythmias: Secondary | ICD-10-CM

## 2013-11-16 ENCOUNTER — Ambulatory Visit (INDEPENDENT_AMBULATORY_CARE_PROVIDER_SITE_OTHER): Payer: Medicare PPO | Admitting: Pharmacist

## 2013-11-16 DIAGNOSIS — I4891 Unspecified atrial fibrillation: Secondary | ICD-10-CM

## 2013-11-16 DIAGNOSIS — Z7901 Long term (current) use of anticoagulants: Secondary | ICD-10-CM

## 2013-11-16 LAB — POCT INR: INR: 2.1

## 2013-12-07 ENCOUNTER — Other Ambulatory Visit: Payer: Self-pay

## 2013-12-07 MED ORDER — TRIAMTERENE-HCTZ 37.5-25 MG PO TABS: ORAL_TABLET | ORAL | Status: DC

## 2013-12-24 ENCOUNTER — Other Ambulatory Visit: Payer: Self-pay | Admitting: *Deleted

## 2013-12-24 MED ORDER — WARFARIN SODIUM 3 MG PO TABS: ORAL_TABLET | ORAL | Status: DC

## 2013-12-28 ENCOUNTER — Ambulatory Visit (INDEPENDENT_AMBULATORY_CARE_PROVIDER_SITE_OTHER): Payer: Medicare PPO | Admitting: *Deleted

## 2013-12-28 DIAGNOSIS — I4891 Unspecified atrial fibrillation: Secondary | ICD-10-CM

## 2013-12-28 DIAGNOSIS — Z5181 Encounter for therapeutic drug level monitoring: Secondary | ICD-10-CM

## 2013-12-28 DIAGNOSIS — Z7901 Long term (current) use of anticoagulants: Secondary | ICD-10-CM

## 2013-12-28 LAB — POCT INR: INR: 1.8

## 2014-02-01 ENCOUNTER — Ambulatory Visit (INDEPENDENT_AMBULATORY_CARE_PROVIDER_SITE_OTHER): Payer: Medicare PPO | Admitting: *Deleted

## 2014-02-01 DIAGNOSIS — Z7901 Long term (current) use of anticoagulants: Secondary | ICD-10-CM

## 2014-02-01 DIAGNOSIS — I4891 Unspecified atrial fibrillation: Secondary | ICD-10-CM

## 2014-02-01 DIAGNOSIS — Z5181 Encounter for therapeutic drug level monitoring: Secondary | ICD-10-CM

## 2014-02-01 LAB — POCT INR: INR: 2.3

## 2014-02-19 ENCOUNTER — Encounter: Payer: Self-pay | Admitting: Internal Medicine

## 2014-02-19 DIAGNOSIS — I498 Other specified cardiac arrhythmias: Secondary | ICD-10-CM

## 2014-02-22 ENCOUNTER — Ambulatory Visit (INDEPENDENT_AMBULATORY_CARE_PROVIDER_SITE_OTHER): Payer: Medicare PPO | Admitting: *Deleted

## 2014-02-22 DIAGNOSIS — Z7901 Long term (current) use of anticoagulants: Secondary | ICD-10-CM

## 2014-02-22 DIAGNOSIS — Z5181 Encounter for therapeutic drug level monitoring: Secondary | ICD-10-CM

## 2014-02-22 DIAGNOSIS — I4891 Unspecified atrial fibrillation: Secondary | ICD-10-CM

## 2014-02-22 LAB — POCT INR: INR: 2.2

## 2014-03-22 ENCOUNTER — Ambulatory Visit (INDEPENDENT_AMBULATORY_CARE_PROVIDER_SITE_OTHER): Payer: Medicare PPO

## 2014-03-22 DIAGNOSIS — Z7901 Long term (current) use of anticoagulants: Secondary | ICD-10-CM

## 2014-03-22 DIAGNOSIS — Z5181 Encounter for therapeutic drug level monitoring: Secondary | ICD-10-CM

## 2014-03-22 DIAGNOSIS — I4891 Unspecified atrial fibrillation: Secondary | ICD-10-CM

## 2014-03-22 LAB — POCT INR: INR: 2.4

## 2014-05-03 ENCOUNTER — Ambulatory Visit (INDEPENDENT_AMBULATORY_CARE_PROVIDER_SITE_OTHER): Payer: Medicare PPO | Admitting: *Deleted

## 2014-05-03 DIAGNOSIS — I4891 Unspecified atrial fibrillation: Secondary | ICD-10-CM

## 2014-05-03 DIAGNOSIS — Z7901 Long term (current) use of anticoagulants: Secondary | ICD-10-CM

## 2014-05-03 DIAGNOSIS — Z5181 Encounter for therapeutic drug level monitoring: Secondary | ICD-10-CM

## 2014-05-03 LAB — POCT INR: INR: 2.6

## 2014-05-21 ENCOUNTER — Encounter: Payer: Self-pay | Admitting: Internal Medicine

## 2014-05-21 DIAGNOSIS — I498 Other specified cardiac arrhythmias: Secondary | ICD-10-CM

## 2014-06-14 ENCOUNTER — Ambulatory Visit (INDEPENDENT_AMBULATORY_CARE_PROVIDER_SITE_OTHER): Payer: Medicare PPO | Admitting: *Deleted

## 2014-06-14 DIAGNOSIS — Z7901 Long term (current) use of anticoagulants: Secondary | ICD-10-CM

## 2014-06-14 DIAGNOSIS — Z5181 Encounter for therapeutic drug level monitoring: Secondary | ICD-10-CM

## 2014-06-14 DIAGNOSIS — I4891 Unspecified atrial fibrillation: Secondary | ICD-10-CM

## 2014-06-14 LAB — POCT INR: INR: 1.4

## 2014-06-28 ENCOUNTER — Ambulatory Visit (INDEPENDENT_AMBULATORY_CARE_PROVIDER_SITE_OTHER): Payer: Medicare PPO | Admitting: Pharmacist

## 2014-06-28 DIAGNOSIS — Z7901 Long term (current) use of anticoagulants: Secondary | ICD-10-CM

## 2014-06-28 DIAGNOSIS — I4891 Unspecified atrial fibrillation: Secondary | ICD-10-CM

## 2014-06-28 DIAGNOSIS — Z5181 Encounter for therapeutic drug level monitoring: Secondary | ICD-10-CM

## 2014-06-28 LAB — POCT INR: INR: 2.9

## 2014-07-16 ENCOUNTER — Ambulatory Visit (INDEPENDENT_AMBULATORY_CARE_PROVIDER_SITE_OTHER): Payer: Medicare PPO | Admitting: *Deleted

## 2014-07-16 ENCOUNTER — Ambulatory Visit (INDEPENDENT_AMBULATORY_CARE_PROVIDER_SITE_OTHER): Payer: Medicare PPO | Admitting: Internal Medicine

## 2014-07-16 ENCOUNTER — Encounter: Payer: Self-pay | Admitting: Internal Medicine

## 2014-07-16 VITALS — BP 124/58 | HR 80 | Ht 61.0 in | Wt 98.0 lb

## 2014-07-16 DIAGNOSIS — I10 Essential (primary) hypertension: Secondary | ICD-10-CM

## 2014-07-16 DIAGNOSIS — I4891 Unspecified atrial fibrillation: Secondary | ICD-10-CM

## 2014-07-16 DIAGNOSIS — Z7901 Long term (current) use of anticoagulants: Secondary | ICD-10-CM

## 2014-07-16 DIAGNOSIS — I480 Paroxysmal atrial fibrillation: Secondary | ICD-10-CM

## 2014-07-16 DIAGNOSIS — Z5181 Encounter for therapeutic drug level monitoring: Secondary | ICD-10-CM

## 2014-07-16 DIAGNOSIS — Z950 Presence of cardiac pacemaker: Secondary | ICD-10-CM

## 2014-07-16 DIAGNOSIS — I499 Cardiac arrhythmia, unspecified: Secondary | ICD-10-CM

## 2014-07-16 LAB — MDC_IDC_ENUM_SESS_TYPE_INCLINIC
Battery Impedance: 10500 Ohm
Battery Remaining Longevity: 12 mo
Brady Statistic RA Percent Paced: 81 %
Date Time Interrogation Session: 20150901095303
Implantable Pulse Generator Model: 5820
Implantable Pulse Generator Serial Number: 1961170
Lead Channel Impedance Value: 342 Ohm
Lead Channel Impedance Value: 379 Ohm
Lead Channel Pacing Threshold Pulse Width: 0.4 ms
Lead Channel Pacing Threshold Pulse Width: 0.4 ms
Lead Channel Sensing Intrinsic Amplitude: 9.7 mV
Lead Channel Setting Pacing Pulse Width: 0.4 ms
Lead Channel Setting Sensing Sensitivity: 2 mV
MDC IDC MSMT BATTERY VOLTAGE: 2.71 V
MDC IDC MSMT LEADCHNL RA PACING THRESHOLD AMPLITUDE: 0.5 V
MDC IDC MSMT LEADCHNL RA SENSING INTR AMPL: 3.2 mV
MDC IDC MSMT LEADCHNL RV PACING THRESHOLD AMPLITUDE: 1 V
MDC IDC SET LEADCHNL RA PACING AMPLITUDE: 2 V
MDC IDC SET LEADCHNL RV PACING AMPLITUDE: 2.5 V
MDC IDC STAT BRADY RV PERCENT PACED: 1 %

## 2014-07-16 LAB — POCT INR: INR: 2.8

## 2014-07-16 NOTE — Patient Instructions (Signed)
Your physician wants you to follow-up in: 6 months with device clinic and 12 months with Dr Taylor You will receive a reminder letter in the mail two months in advance. If you don't receive a letter, please call our office to schedule the follow-up appointment.  

## 2014-07-16 NOTE — Progress Notes (Signed)
HPI [name] [name] returns today for followup. She is a 80 year old woman with a history of symptomatic bradycardia, status post permanent pacemaker insertion, paroxysmal atrial fibrillation, and hypertension. She remains on Coumadin. In the interim, she has done well except for problems with weight loss. Her appetite is down. She is sedentary and notes that she has had some weakness. She lost her husband of 70 years in May. No Known Allergies   Current Outpatient Prescriptions  Medication Sig Dispense Refill  . bimatoprost (LUMIGAN) 0.03 % ophthalmic solution Place 1 drop into both eyes daily.        . dorzolamide (TRUSOPT) 2 % ophthalmic solution Place 1 drop into both eyes 2 (two) times daily.        . Multiple Vitamins-Minerals (MULTIVITAL) tablet Take 1 tablet by mouth daily.        Marland Kitchen triamterene-hydrochlorothiazide (MAXZIDE-25) 37.5-25 MG per tablet TAKE ONE (1) TABLET EACH DAY  30 tablet  6  . warfarin (COUMADIN) 3 MG tablet Take as directed by coumadin clinic  30 tablet  3   No current facility-administered medications for this visit.     Past Medical History  Diagnosis Date  . Cardiac dysrhythmia, unspecified   . HTN (hypertension)   . Cardiac pacemaker in situ   . Syncope   . Bradycardia     ROS:   All systems reviewed and negative except as noted in the HPI.   Past Surgical History  Procedure Laterality Date  . Implantation of dual chamber pacemaker  05/09/08    St Jude dual-chamber. Dr. Ladona Ridgel      No family history on file.   History   Social History  . Marital Status: Married    Spouse Name: N/A    Number of Children: N/A  . Years of Education: N/A   Occupational History  . Not on file.   Social History Main Topics  . Smoking status: Never Smoker   . Smokeless tobacco: Not on file  . Alcohol Use: No  . Drug Use: No  . Sexual Activity: Not on file   Other Topics Concern  . Not on file   Social History Narrative  . No narrative on file     BP  124/58  Pulse 80  Ht  (1.549 m)  Wt 98 lb (44.453 kg)  BMI 18.53 kg/m2  Physical Exam:  frail appearing elderly woman,NAD HEENT: Unremarkable Neck:  No JVD, no thyromegally Back:  No CVA tenderness Lungs:  Clear with no wheezes, rales, or rhonchi. HEART:  Regular rate rhythm, no murmurs, no rubs, no clicks Abd:  soft, positive bowel sounds, no organomegally, no rebound, no guarding Ext:  2 plus pulses, no edema, no cyanosis, no clubbing Skin:  No rashes no nodules Neuro:  CN II through XII intact, motor grossly intact  DEVICE  Normal device function.  See PaceArt for details. One year of battery longevity.  Assess/Plan:

## 2014-07-16 NOTE — Assessment & Plan Note (Signed)
Her St. Jude PPM is working normally but has about one year of battery longevity.

## 2014-07-16 NOTE — Assessment & Plan Note (Signed)
She is in NSR today and her ventricular rate appears to be well controlled.

## 2014-07-16 NOTE — Assessment & Plan Note (Signed)
Her blood pressure is well controlled today. No change in meds.  

## 2014-07-24 ENCOUNTER — Encounter: Payer: Self-pay | Admitting: Internal Medicine

## 2014-08-15 ENCOUNTER — Encounter: Payer: Self-pay | Admitting: Internal Medicine

## 2014-08-15 ENCOUNTER — Ambulatory Visit (INDEPENDENT_AMBULATORY_CARE_PROVIDER_SITE_OTHER): Payer: Medicare PPO | Admitting: *Deleted

## 2014-08-15 DIAGNOSIS — R001 Bradycardia, unspecified: Secondary | ICD-10-CM

## 2014-08-15 DIAGNOSIS — Z5181 Encounter for therapeutic drug level monitoring: Secondary | ICD-10-CM

## 2014-08-15 DIAGNOSIS — I4891 Unspecified atrial fibrillation: Secondary | ICD-10-CM

## 2014-08-15 DIAGNOSIS — Z7901 Long term (current) use of anticoagulants: Secondary | ICD-10-CM

## 2014-08-15 LAB — POCT INR: INR: 2.4

## 2014-08-30 ENCOUNTER — Telehealth: Payer: Self-pay

## 2014-09-02 MED ORDER — WARFARIN SODIUM 3 MG PO TABS: ORAL_TABLET | ORAL | Status: DC

## 2014-09-02 NOTE — Telephone Encounter (Signed)
Rx done. 

## 2014-09-12 ENCOUNTER — Ambulatory Visit (INDEPENDENT_AMBULATORY_CARE_PROVIDER_SITE_OTHER): Payer: Medicare PPO | Admitting: *Deleted

## 2014-09-12 DIAGNOSIS — Z7901 Long term (current) use of anticoagulants: Secondary | ICD-10-CM

## 2014-09-12 DIAGNOSIS — I4891 Unspecified atrial fibrillation: Secondary | ICD-10-CM

## 2014-09-12 DIAGNOSIS — Z5181 Encounter for therapeutic drug level monitoring: Secondary | ICD-10-CM

## 2014-09-12 LAB — POCT INR: INR: 2

## 2014-09-20 ENCOUNTER — Encounter: Payer: Self-pay | Admitting: Internal Medicine

## 2014-09-20 DIAGNOSIS — R001 Bradycardia, unspecified: Secondary | ICD-10-CM

## 2014-10-07 ENCOUNTER — Ambulatory Visit (INDEPENDENT_AMBULATORY_CARE_PROVIDER_SITE_OTHER): Payer: Medicare PPO | Admitting: *Deleted

## 2014-10-07 DIAGNOSIS — Z7901 Long term (current) use of anticoagulants: Secondary | ICD-10-CM

## 2014-10-07 DIAGNOSIS — I4891 Unspecified atrial fibrillation: Secondary | ICD-10-CM

## 2014-10-07 DIAGNOSIS — Z5181 Encounter for therapeutic drug level monitoring: Secondary | ICD-10-CM

## 2014-10-07 LAB — POCT INR: INR: 3.2

## 2014-10-09 ENCOUNTER — Other Ambulatory Visit: Payer: Self-pay | Admitting: *Deleted

## 2014-10-09 MED ORDER — TRIAMTERENE-HCTZ 37.5-25 MG PO TABS: ORAL_TABLET | ORAL | Status: DC

## 2014-10-14 ENCOUNTER — Other Ambulatory Visit: Payer: Self-pay

## 2014-10-14 MED ORDER — TRIAMTERENE-HCTZ 37.5-25 MG PO TABS: ORAL_TABLET | ORAL | Status: AC

## 2014-10-14 MED ORDER — TRIAMTERENE-HCTZ 37.5-25 MG PO TABS: ORAL_TABLET | ORAL | Status: DC

## 2014-10-17 ENCOUNTER — Encounter: Payer: Self-pay | Admitting: Internal Medicine

## 2014-10-17 DIAGNOSIS — R001 Bradycardia, unspecified: Secondary | ICD-10-CM

## 2014-11-04 ENCOUNTER — Ambulatory Visit (INDEPENDENT_AMBULATORY_CARE_PROVIDER_SITE_OTHER): Payer: Medicare PPO | Admitting: *Deleted

## 2014-11-04 DIAGNOSIS — I4891 Unspecified atrial fibrillation: Secondary | ICD-10-CM

## 2014-11-04 DIAGNOSIS — Z5181 Encounter for therapeutic drug level monitoring: Secondary | ICD-10-CM

## 2014-11-04 DIAGNOSIS — Z7901 Long term (current) use of anticoagulants: Secondary | ICD-10-CM

## 2014-11-04 LAB — POCT INR: INR: 2.3

## 2014-11-21 ENCOUNTER — Encounter: Payer: Self-pay | Admitting: Internal Medicine

## 2014-11-21 DIAGNOSIS — R001 Bradycardia, unspecified: Secondary | ICD-10-CM

## 2014-11-28 ENCOUNTER — Ambulatory Visit (INDEPENDENT_AMBULATORY_CARE_PROVIDER_SITE_OTHER): Payer: Medicare PPO | Admitting: *Deleted

## 2014-11-28 DIAGNOSIS — R001 Bradycardia, unspecified: Secondary | ICD-10-CM | POA: Diagnosis not present

## 2014-11-28 LAB — MDC_IDC_ENUM_SESS_TYPE_INCLINIC
Battery Impedance: 25800 Ohm
Battery Remaining Longevity: 3 mo
Battery Voltage: 2.61 V
Brady Statistic RV Percent Paced: 1 %
Implantable Pulse Generator Model: 5820
Implantable Pulse Generator Serial Number: 1961170
Lead Channel Impedance Value: 379 Ohm
Lead Channel Pacing Threshold Amplitude: 1.25 V
Lead Channel Pacing Threshold Pulse Width: 0.4 ms
Lead Channel Pacing Threshold Pulse Width: 0.4 ms
Lead Channel Sensing Intrinsic Amplitude: 3.8 mV
Lead Channel Setting Pacing Amplitude: 2 V
Lead Channel Setting Pacing Amplitude: 2.5 V
Lead Channel Setting Pacing Pulse Width: 0.4 ms
MDC IDC MSMT LEADCHNL RA IMPEDANCE VALUE: 347 Ohm
MDC IDC MSMT LEADCHNL RA PACING THRESHOLD AMPLITUDE: 0.75 V
MDC IDC MSMT LEADCHNL RV SENSING INTR AMPL: 9.2 mV
MDC IDC SESS DTM: 20160114120511
MDC IDC SET LEADCHNL RV SENSING SENSITIVITY: 2 mV
MDC IDC STAT BRADY RA PERCENT PACED: 80 %

## 2014-11-28 NOTE — Progress Notes (Signed)
Pacemaker check in clinic. Normal device function. Thresholds, sensing, impedances consistent with previous measurements. Device programmed to maximize longevity. 491 mode switches, no EGM's available, + coumadin.  Total burden <1%.  No high ventricular rates noted. Device programmed at appropriate safety margins. Histogram distribution appropriate for patient activity level. Device programmed to optimize intrinsic conduction.  Patient education completed.  Device @ ERI since 10/03/14.  ROV 12/12/14 with Dr. Ladona Ridgelaylor to discuss change out.

## 2014-12-02 ENCOUNTER — Ambulatory Visit (INDEPENDENT_AMBULATORY_CARE_PROVIDER_SITE_OTHER): Payer: Medicare PPO

## 2014-12-02 DIAGNOSIS — Z7901 Long term (current) use of anticoagulants: Secondary | ICD-10-CM

## 2014-12-02 DIAGNOSIS — Z5181 Encounter for therapeutic drug level monitoring: Secondary | ICD-10-CM

## 2014-12-02 DIAGNOSIS — I4891 Unspecified atrial fibrillation: Secondary | ICD-10-CM

## 2014-12-02 LAB — POCT INR: INR: 1.7

## 2014-12-12 ENCOUNTER — Ambulatory Visit (INDEPENDENT_AMBULATORY_CARE_PROVIDER_SITE_OTHER): Payer: Medicare PPO | Admitting: Internal Medicine

## 2014-12-12 ENCOUNTER — Encounter: Payer: Self-pay | Admitting: *Deleted

## 2014-12-12 ENCOUNTER — Encounter: Payer: Self-pay | Admitting: Internal Medicine

## 2014-12-12 ENCOUNTER — Ambulatory Visit (INDEPENDENT_AMBULATORY_CARE_PROVIDER_SITE_OTHER): Payer: Medicare PPO | Admitting: *Deleted

## 2014-12-12 VITALS — BP 167/74 | HR 65 | Ht 62.0 in | Wt 108.8 lb

## 2014-12-12 DIAGNOSIS — Z01812 Encounter for preprocedural laboratory examination: Secondary | ICD-10-CM

## 2014-12-12 DIAGNOSIS — I480 Paroxysmal atrial fibrillation: Secondary | ICD-10-CM

## 2014-12-12 DIAGNOSIS — Z45018 Encounter for adjustment and management of other part of cardiac pacemaker: Secondary | ICD-10-CM

## 2014-12-12 DIAGNOSIS — R55 Syncope and collapse: Secondary | ICD-10-CM

## 2014-12-12 DIAGNOSIS — I10 Essential (primary) hypertension: Secondary | ICD-10-CM

## 2014-12-12 DIAGNOSIS — R001 Bradycardia, unspecified: Secondary | ICD-10-CM

## 2014-12-12 DIAGNOSIS — Z950 Presence of cardiac pacemaker: Secondary | ICD-10-CM

## 2014-12-12 DIAGNOSIS — Z5181 Encounter for therapeutic drug level monitoring: Secondary | ICD-10-CM

## 2014-12-12 DIAGNOSIS — Z45010 Encounter for checking and testing of cardiac pacemaker pulse generator [battery]: Secondary | ICD-10-CM

## 2014-12-12 DIAGNOSIS — Z7901 Long term (current) use of anticoagulants: Secondary | ICD-10-CM

## 2014-12-12 DIAGNOSIS — I4891 Unspecified atrial fibrillation: Secondary | ICD-10-CM

## 2014-12-12 LAB — CBC WITH DIFFERENTIAL/PLATELET
BASOS ABS: 0 10*3/uL (ref 0.0–0.1)
BASOS PCT: 0.5 % (ref 0.0–3.0)
EOS ABS: 0.1 10*3/uL (ref 0.0–0.7)
Eosinophils Relative: 2.2 % (ref 0.0–5.0)
HCT: 31.5 % — ABNORMAL LOW (ref 36.0–46.0)
Hemoglobin: 10.8 g/dL — ABNORMAL LOW (ref 12.0–15.0)
Lymphocytes Relative: 40.3 % (ref 12.0–46.0)
Lymphs Abs: 1.2 10*3/uL (ref 0.7–4.0)
MCHC: 34.4 g/dL (ref 30.0–36.0)
MCV: 93.4 fl (ref 78.0–100.0)
Monocytes Absolute: 0.4 10*3/uL (ref 0.1–1.0)
Monocytes Relative: 13.5 % — ABNORMAL HIGH (ref 3.0–12.0)
NEUTROS PCT: 43.5 % (ref 43.0–77.0)
Neutro Abs: 1.3 10*3/uL — ABNORMAL LOW (ref 1.4–7.7)
Platelets: 141 10*3/uL — ABNORMAL LOW (ref 150.0–400.0)
RBC: 3.37 Mil/uL — ABNORMAL LOW (ref 3.87–5.11)
RDW: 13.7 % (ref 11.5–15.5)
WBC: 3 10*3/uL — AB (ref 4.0–10.5)

## 2014-12-12 LAB — BASIC METABOLIC PANEL
BUN: 31 mg/dL — AB (ref 6–23)
CO2: 25 meq/L (ref 19–32)
CREATININE: 1.12 mg/dL (ref 0.40–1.20)
Calcium: 9.8 mg/dL (ref 8.4–10.5)
Chloride: 106 mEq/L (ref 96–112)
GFR: 58.51 mL/min — AB (ref >60.00)
GLUCOSE: 97 mg/dL (ref 70–99)
POTASSIUM: 3.8 meq/L (ref 3.5–5.1)
Sodium: 140 mEq/L (ref 135–145)

## 2014-12-12 LAB — PROTIME-INR
INR: 2.1 ratio — AB (ref 0.8–1.0)
Prothrombin Time: 22.4 s — ABNORMAL HIGH (ref 9.6–13.1)

## 2014-12-12 LAB — POCT INR: INR: 2

## 2014-12-12 NOTE — Assessment & Plan Note (Signed)
Her ventricular rate is well controlled. She will hold her Warfarin prior to generator change out.

## 2014-12-12 NOTE — Assessment & Plan Note (Signed)
Her systolic blood pressure is elevated. She is encouraged to reduce her sodium intake.

## 2014-12-12 NOTE — Assessment & Plan Note (Signed)
She has had no recurrent symptoms since her device was placed. Will follow.

## 2014-12-12 NOTE — Progress Notes (Signed)
HPI [name] returns today for followup. She is a 81 year old woman with a history of symptomatic bradycardia, status post permanent pacemaker insertion, paroxysmal atrial fibrillation, and hypertension. She remains on Coumadin. In the interim, she has been stable. She denies chest pain. No falls. She thinks that her weight has stabilized. She has reached ERI on her PPM. No Known Allergies   Current Outpatient Prescriptions  Medication Sig Dispense Refill  . bimatoprost (LUMIGAN) 0.03 % ophthalmic solution Place 1 drop into both eyes daily.      . dorzolamide (TRUSOPT) 2 % ophthalmic solution Place 1 drop into both eyes 2 (two) times daily.      . Multiple Vitamins-Minerals (MULTIVITAL) tablet Take 1 tablet by mouth daily.      Marland Kitchen. warfarin (COUMADIN) 3 MG tablet Take as directed by coumadin clinic 30 tablet 3  . triamterene-hydrochlorothiazide (MAXZIDE-25) 37.5-25 MG per tablet TAKE ONE (1) TABLET EACH DAY 30 tablet 11   No current facility-administered medications for this visit.     Past Medical History  Diagnosis Date  . Cardiac dysrhythmia, unspecified   . HTN (hypertension)   . Cardiac pacemaker in situ   . Syncope   . Bradycardia     ROS:   All systems reviewed and negative except as noted in the HPI.   Past Surgical History  Procedure Laterality Date  . Implantation of dual chamber pacemaker  05/09/08    St Jude dual-chamber. Dr. Ladona Ridgelaylor      History reviewed. No pertinent family history.   History   Social History  . Marital Status: Married    Spouse Name: N/A    Number of Children: N/A  . Years of Education: N/A   Occupational History  . Not on file.   Social History Main Topics  . Smoking status: Never Smoker   . Smokeless tobacco: Not on file  . Alcohol Use: No  . Drug Use: No  . Sexual Activity: Not on file   Other Topics Concern  . Not on file   Social History Narrative     BP 167/74 mmHg  Pulse 65  Ht 5\' 2"  (1.575 m)  Wt 108 lb 12.8 oz  (49.351 kg)  BMI 19.89 kg/m2  Physical Exam:  frail appearing elderly woman,NAD HEENT: Unremarkable Neck:  No JVD, no thyromegally Back:  No CVA tenderness Lungs:  Clear with no wheezes, rales, or rhonchi. HEART:  Regular rate rhythm, no murmurs, no rubs, no clicks Abd:  soft, positive bowel sounds, no organomegally, no rebound, no guarding Ext:  2 plus pulses, no edema, no cyanosis, no clubbing Skin:  No rashes no nodules Neuro:  CN II through XII intact, motor grossly intact  DEVICE  Normal device function.  See PaceArt for details. [facility]  Assess/Plan:

## 2014-12-12 NOTE — Assessment & Plan Note (Signed)
Her device has reached ERI. Will schedule PM generator change out.

## 2014-12-12 NOTE — Patient Instructions (Addendum)
Your physician recommends that you continue on your current medications as directed. Please refer to the Current Medication list given to you today.  Pre-procedure labs today.   Your physician has recommended that you have a pacemaker generator change. Please see the instruction sheet given to you today for more information.  Your wound check is scheduled for 12/30/14 at 9:30 a.m.  at [address].

## 2014-12-19 DIAGNOSIS — Z7901 Long term (current) use of anticoagulants: Secondary | ICD-10-CM | POA: Diagnosis not present

## 2014-12-19 DIAGNOSIS — Z45018 Encounter for adjustment and management of other part of cardiac pacemaker: Secondary | ICD-10-CM | POA: Diagnosis present

## 2014-12-19 DIAGNOSIS — I480 Paroxysmal atrial fibrillation: Secondary | ICD-10-CM | POA: Diagnosis not present

## 2014-12-19 DIAGNOSIS — Z950 Presence of cardiac pacemaker: Secondary | ICD-10-CM | POA: Diagnosis not present

## 2014-12-19 DIAGNOSIS — I10 Essential (primary) hypertension: Secondary | ICD-10-CM | POA: Diagnosis not present

## 2014-12-19 DIAGNOSIS — I495 Sick sinus syndrome: Secondary | ICD-10-CM | POA: Diagnosis not present

## 2014-12-19 MED ORDER — SODIUM CHLORIDE 0.9 % IR SOLN
80.0000 mg | Status: AC
  Filled 2014-12-19: qty 2

## 2014-12-19 MED ORDER — CEFAZOLIN SODIUM-DEXTROSE 2-3 GM-% IV SOLR: 2.0000 g | INTRAVENOUS | Status: AC

## 2014-12-19 MED ORDER — MUPIROCIN 2 % EX OINT
1.0000 "application " | TOPICAL_OINTMENT | Freq: Once | CUTANEOUS | Status: AC
  Administered 2014-12-20: 1 via TOPICAL
  Filled 2014-12-19: qty 22

## 2014-12-19 MED ORDER — SODIUM CHLORIDE 0.9 % IV SOLN
INTRAVENOUS | Status: DC
  Administered 2014-12-20: 10:00:00 via INTRAVENOUS

## 2014-12-20 ENCOUNTER — Encounter (HOSPITAL_COMMUNITY): Payer: Self-pay | Admitting: Internal Medicine

## 2014-12-20 ENCOUNTER — Encounter: Payer: Self-pay | Admitting: Internal Medicine

## 2014-12-20 ENCOUNTER — Encounter (HOSPITAL_COMMUNITY)
Admission: RE | Disposition: A | Discharge status: Home / Self Care | Payer: Self-pay | Source: Ambulatory Visit | Attending: Internal Medicine

## 2014-12-20 ENCOUNTER — Ambulatory Visit (HOSPITAL_COMMUNITY)
Admission: RE | Admit: 2014-12-20 | Discharge: 2014-12-20 | Disposition: A | Discharge status: Home / Self Care | Payer: Medicare PPO | Source: Ambulatory Visit | Attending: Internal Medicine | Admitting: Internal Medicine

## 2014-12-20 DIAGNOSIS — I495 Sick sinus syndrome: Secondary | ICD-10-CM

## 2014-12-20 DIAGNOSIS — Z950 Presence of cardiac pacemaker: Secondary | ICD-10-CM | POA: Insufficient documentation

## 2014-12-20 DIAGNOSIS — I10 Essential (primary) hypertension: Secondary | ICD-10-CM | POA: Diagnosis not present

## 2014-12-20 DIAGNOSIS — I480 Paroxysmal atrial fibrillation: Secondary | ICD-10-CM | POA: Insufficient documentation

## 2014-12-20 DIAGNOSIS — Z7901 Long term (current) use of anticoagulants: Secondary | ICD-10-CM | POA: Insufficient documentation

## 2014-12-20 DIAGNOSIS — Z45018 Encounter for adjustment and management of other part of cardiac pacemaker: Secondary | ICD-10-CM | POA: Diagnosis not present

## 2014-12-20 DIAGNOSIS — Z45010 Encounter for checking and testing of cardiac pacemaker pulse generator [battery]: Secondary | ICD-10-CM

## 2014-12-20 HISTORY — PX: PERMANENT PACEMAKER GENERATOR CHANGE: SHX6022

## 2014-12-20 LAB — SURGICAL PCR SCREEN
MRSA, PCR: NEGATIVE
Staphylococcus aureus: NEGATIVE

## 2014-12-20 LAB — PROTIME-INR
INR: 1.92 — ABNORMAL HIGH (ref 0.00–1.49)
Prothrombin Time: 22.1 seconds — ABNORMAL HIGH (ref 11.6–15.2)

## 2014-12-20 SURGERY — PERMANENT PACEMAKER GENERATOR CHANGE
Anesthesia: LOCAL

## 2014-12-20 MED ORDER — MUPIROCIN 2 % EX OINT
TOPICAL_OINTMENT | CUTANEOUS | Status: AC
  Administered 2014-12-20: 1 via TOPICAL
  Filled 2014-12-20: qty 22

## 2014-12-20 MED ORDER — MUPIROCIN 2 % EX OINT
TOPICAL_OINTMENT | Freq: Two times a day (BID) | CUTANEOUS | Status: DC
  Filled 2014-12-20: qty 22

## 2014-12-20 MED ORDER — MIDAZOLAM HCL 5 MG/5ML IJ SOLN
INTRAMUSCULAR | Status: AC
  Filled 2014-12-20: qty 5

## 2014-12-20 MED ORDER — HEPARIN (PORCINE) IN NACL 2-0.9 UNIT/ML-% IJ SOLN
INTRAMUSCULAR | Status: AC
  Filled 2014-12-20: qty 1000

## 2014-12-20 MED ORDER — LIDOCAINE HCL (PF) 1 % IJ SOLN
INTRAMUSCULAR | Status: AC
  Filled 2014-12-20: qty 60

## 2014-12-20 MED ORDER — FENTANYL CITRATE 0.05 MG/ML IJ SOLN
INTRAMUSCULAR | Status: AC
  Filled 2014-12-20: qty 2

## 2014-12-20 NOTE — Interval H&P Note (Signed)
History and Physical Interval Note:  12/20/2014 7:30 AM  [name] [name]  has presented today for surgery, with the diagnosis of battery depletion  The various methods of treatment have been discussed with the patient and family. After consideration of risks, benefits and other options for treatment, the patient has consented to  Procedure(s): PERMANENT PACEMAKER GENERATOR CHANGE (N/A) as a surgical intervention .  The patient's history has been reviewed, patient examined, no change in status, stable for surgery.  I have reviewed the patient's chart and labs.  Questions were answered to the patient's satisfaction.     Leonia ReevesGregg Taylor,M.D.

## 2014-12-20 NOTE — CV Procedure (Signed)
Electrophysiology procedure note  Procedure: Removal of a previously implanted pacemaker which had reached elective replacement in a patient with sinus node dysfunction, and insertion of a new dual-chamber pacemaker  Preoperative diagnosis: Symptomatic sinus node dysfunction, status post pacemaker insertion, with the current device at elective replacement  Postoperative diagnosis: Same as preoperative diagnosis  Description of the procedure: After informed consent was obtained, the patient was taken to the diagnostic electrophysiology laboratory in the fasting state. After the usual preparation draping, intravenous Versed and fentanyl was used for sedation. 30 cc of lidocaine was infiltrated into the left infraclavicular region. A 5 cm incision was carried out. Electrocautery was utilized to dissect down to the pacemaker pocket. The pacemaker was removed with gentle traction. Electrocautery was utilized to free up the fibrous adhesions. Electrocautery was utilized to revise the pocket to accommodate the larger dual-chamber pacemaker. The pocket was irrigated with antibiotic irrigation. The atrial and ventricular leads were evaluated. The atrial threshold was 0.5 V at 0.4 ms. The ventricular threshold was 1 V at 0.4 ms. The impedance was 300 in the atrium and 400 in the ventricle. P and R waves were satisfactory. The new St. Jude dual-chamber pacemaker, serial number [id number], was connected to the old atrial and ventricular pacing leads and placed back in the newly created subcutaneous pocket. The pocket was irrigated with antibiotic irrigation. Electrocautery was utilized to assure hemostasis. The incision was closed with 2 layers of Vicryl suture. Benzoin and Steri-Strips was placed on the skin. A bandage was placed and the patient was returned to her room in satisfactory condition.  Complications: There were no immediate procedural complications  Conclusion: Successful removal of a previously implanted  dual-chamber pacemaker in a patient with sinus node dysfunction who had reached elective replacement followed by successful insertion of a new dual-chamber pacemaker with pacemaker pocket revision. Estimated blood loss less than 10 cc.  Lewayne BuntingGregg Mariska Daffin, M.D.

## 2014-12-20 NOTE — H&P (View-Only) (Signed)
HPI [name] returns today for followup. She is a 81 year old woman with a history of symptomatic bradycardia, status post permanent pacemaker insertion, paroxysmal atrial fibrillation, and hypertension. She remains on Coumadin. In the interim, she has been stable. She denies chest pain. No falls. She thinks that her weight has stabilized. She has reached ERI on her PPM. No Known Allergies   Current Outpatient Prescriptions  Medication Sig Dispense Refill  . bimatoprost (LUMIGAN) 0.03 % ophthalmic solution Place 1 drop into both eyes daily.      . dorzolamide (TRUSOPT) 2 % ophthalmic solution Place 1 drop into both eyes 2 (two) times daily.      . Multiple Vitamins-Minerals (MULTIVITAL) tablet Take 1 tablet by mouth daily.      Marland Kitchen. warfarin (COUMADIN) 3 MG tablet Take as directed by coumadin clinic 30 tablet 3  . triamterene-hydrochlorothiazide (MAXZIDE-25) 37.5-25 MG per tablet TAKE ONE (1) TABLET EACH DAY 30 tablet 11   No current facility-administered medications for this visit.     Past Medical History  Diagnosis Date  . Cardiac dysrhythmia, unspecified   . HTN (hypertension)   . Cardiac pacemaker in situ   . Syncope   . Bradycardia     ROS:   All systems reviewed and negative except as noted in the HPI.   Past Surgical History  Procedure Laterality Date  . Implantation of dual chamber pacemaker  05/09/08    St Jude dual-chamber. Dr. Ladona Ridgelaylor      History reviewed. No pertinent family history.   History   Social History  . Marital Status: Married    Spouse Name: N/A    Number of Children: N/A  . Years of Education: N/A   Occupational History  . Not on file.   Social History Main Topics  . Smoking status: Never Smoker   . Smokeless tobacco: Not on file  . Alcohol Use: No  . Drug Use: No  . Sexual Activity: Not on file   Other Topics Concern  . Not on file   Social History Narrative     BP 167/74 mmHg  Pulse 65  Ht 5\' 2"  (1.575 m)  Wt 108 lb 12.8 oz  (49.351 kg)  BMI 19.89 kg/m2  Physical Exam:  frail appearing elderly woman,NAD HEENT: Unremarkable Neck:  No JVD, no thyromegally Back:  No CVA tenderness Lungs:  Clear with no wheezes, rales, or rhonchi. HEART:  Regular rate rhythm, no murmurs, no rubs, no clicks Abd:  soft, positive bowel sounds, no organomegally, no rebound, no guarding Ext:  2 plus pulses, no edema, no cyanosis, no clubbing Skin:  No rashes no nodules Neuro:  CN II through XII intact, motor grossly intact  DEVICE  Normal device function.  See [facility] details[facility]  Assess/Plan:

## 2014-12-20 NOTE — Discharge Instructions (Signed)
Pacemaker Battery Change, Care After °Refer to this sheet in the next few weeks. These instructions provide you with information on caring for yourself after your procedure. Your health care provider may also give you more specific instructions. Your treatment has been planned according to current medical practices, but problems sometimes occur. Call your health care provider if you have any problems or questions after your procedure. °WHAT TO EXPECT AFTER THE PROCEDURE °After your procedure, it is typical to have the following sensations: °· Soreness at the pacemaker site. °HOME CARE INSTRUCTIONS  °· Keep the incision clean and dry. °· Unless advised otherwise, you may shower beginning 48 hours after your procedure. °· For the first week after the replacement, avoid stretching motions that pull at the incision site, and avoid heavy exercise with the arm that is on the same side as the incision. °· Take medicines only as directed by your health care provider. °· Keep all follow-up visits as directed by your health care provider. °SEEK MEDICAL CARE IF:  °· You have pain at the incision site that is not relieved by over-the-counter or prescription medicine. °· There is drainage or pus from the incision site. °· There is swelling larger than a lime at the incision site. °· You develop red streaking that extends above or below the incision site. °· You feel brief, intermittent palpitations, light-headedness, or any symptoms that you feel might be related to your heart. °SEEK IMMEDIATE MEDICAL CARE IF:  °· You experience chest pain that is different than the pain at the pacemaker site. °· You experience shortness of breath. °· You have palpitations or irregular heartbeat. °· You have light-headedness that does not go away quickly. °· You faint. °· You have pain that gets worse and is not relieved by medicine. °Document Released: 08/22/2013 Document Revised: 03/18/2014 Document Reviewed: 08/22/2013 °[employer]® Patient  Information ©2015 [employer]. This information is not intended to replace advice given to you by your health care provider. Make sure you discuss any questions you have with your health care provider. ° °

## 2014-12-23 ENCOUNTER — Other Ambulatory Visit: Payer: Self-pay | Admitting: Internal Medicine

## 2014-12-26 ENCOUNTER — Encounter: Payer: Self-pay | Admitting: Internal Medicine

## 2014-12-30 ENCOUNTER — Ambulatory Visit: Payer: Medicare PPO

## 2015-01-02 ENCOUNTER — Ambulatory Visit (INDEPENDENT_AMBULATORY_CARE_PROVIDER_SITE_OTHER): Payer: Medicare PPO | Admitting: *Deleted

## 2015-01-02 DIAGNOSIS — I495 Sick sinus syndrome: Secondary | ICD-10-CM

## 2015-01-02 DIAGNOSIS — Z7901 Long term (current) use of anticoagulants: Secondary | ICD-10-CM

## 2015-01-02 DIAGNOSIS — I4891 Unspecified atrial fibrillation: Secondary | ICD-10-CM

## 2015-01-02 DIAGNOSIS — I480 Paroxysmal atrial fibrillation: Secondary | ICD-10-CM

## 2015-01-02 DIAGNOSIS — Z5181 Encounter for therapeutic drug level monitoring: Secondary | ICD-10-CM

## 2015-01-02 LAB — MDC_IDC_ENUM_SESS_TYPE_INCLINIC
Battery Voltage: 2.99 V
Brady Statistic RA Percent Paced: 96 %
Implantable Pulse Generator Model: 2240
Implantable Pulse Generator Serial Number: 7714522
Lead Channel Impedance Value: 387.5 Ohm
Lead Channel Pacing Threshold Amplitude: 0.5 V
Lead Channel Pacing Threshold Amplitude: 1 V
Lead Channel Pacing Threshold Pulse Width: 0.4 ms
Lead Channel Pacing Threshold Pulse Width: 0.4 ms
Lead Channel Pacing Threshold Pulse Width: 0.4 ms
Lead Channel Sensing Intrinsic Amplitude: 2.6 mV
Lead Channel Sensing Intrinsic Amplitude: 8.4 mV
Lead Channel Setting Pacing Amplitude: 2 V
Lead Channel Setting Sensing Sensitivity: 2 mV
MDC IDC MSMT BATTERY REMAINING LONGEVITY: 122.4 mo
MDC IDC MSMT LEADCHNL RA PACING THRESHOLD AMPLITUDE: 0.5 V
MDC IDC MSMT LEADCHNL RA PACING THRESHOLD PULSEWIDTH: 0.4 ms
MDC IDC MSMT LEADCHNL RV IMPEDANCE VALUE: 387.5 Ohm
MDC IDC MSMT LEADCHNL RV PACING THRESHOLD AMPLITUDE: 1 V
MDC IDC SESS DTM: 20160218165443
MDC IDC SET LEADCHNL RA PACING AMPLITUDE: 2 V
MDC IDC SET LEADCHNL RV PACING PULSEWIDTH: 0.4 ms
MDC IDC STAT BRADY RV PERCENT PACED: 0.03 %

## 2015-01-02 LAB — POCT INR: INR: 2

## 2015-01-02 NOTE — Progress Notes (Signed)
Wound check appointment. Steri-strips removed. Wound without redness or edema. Incision edges approximated, wound well healed. Normal device function. Thresholds, sensing, and impedances consistent with implant measurements. Device programmed at chronic outputs. Histogram distribution appropriate for patient and level of activity. 19 mode switches--- <1%, longest 46sec, max-A 284bpm. No high ventricular rates noted. Patient educated about wound care, arm mobility, lifting restrictions. ROV w/ Dr. Ladona Ridgelaylor 04/08/15.

## 2015-01-21 ENCOUNTER — Encounter: Payer: Self-pay | Admitting: Internal Medicine

## 2015-01-30 ENCOUNTER — Ambulatory Visit (INDEPENDENT_AMBULATORY_CARE_PROVIDER_SITE_OTHER): Payer: Medicare PPO | Admitting: Surgery

## 2015-01-30 DIAGNOSIS — Z7901 Long term (current) use of anticoagulants: Secondary | ICD-10-CM

## 2015-01-30 DIAGNOSIS — I4891 Unspecified atrial fibrillation: Secondary | ICD-10-CM

## 2015-01-30 DIAGNOSIS — Z5181 Encounter for therapeutic drug level monitoring: Secondary | ICD-10-CM

## 2015-01-30 DIAGNOSIS — I480 Paroxysmal atrial fibrillation: Secondary | ICD-10-CM

## 2015-01-30 LAB — POCT INR: INR: 2.3

## 2015-03-13 ENCOUNTER — Ambulatory Visit (INDEPENDENT_AMBULATORY_CARE_PROVIDER_SITE_OTHER): Payer: Medicare PPO | Admitting: *Deleted

## 2015-03-13 DIAGNOSIS — Z5181 Encounter for therapeutic drug level monitoring: Secondary | ICD-10-CM | POA: Diagnosis not present

## 2015-03-13 DIAGNOSIS — I480 Paroxysmal atrial fibrillation: Secondary | ICD-10-CM

## 2015-03-13 DIAGNOSIS — I4891 Unspecified atrial fibrillation: Secondary | ICD-10-CM | POA: Diagnosis not present

## 2015-03-13 DIAGNOSIS — Z7901 Long term (current) use of anticoagulants: Secondary | ICD-10-CM | POA: Diagnosis not present

## 2015-03-13 LAB — POCT INR: INR: 2.4

## 2015-04-08 ENCOUNTER — Encounter: Payer: Self-pay | Admitting: Internal Medicine

## 2015-04-08 ENCOUNTER — Ambulatory Visit (INDEPENDENT_AMBULATORY_CARE_PROVIDER_SITE_OTHER): Payer: Medicare PPO | Admitting: Surgery

## 2015-04-08 ENCOUNTER — Ambulatory Visit (INDEPENDENT_AMBULATORY_CARE_PROVIDER_SITE_OTHER): Payer: Medicare PPO | Admitting: Internal Medicine

## 2015-04-08 VITALS — BP 148/82 | HR 67 | Ht 62.0 in | Wt 107.0 lb

## 2015-04-08 DIAGNOSIS — I10 Essential (primary) hypertension: Secondary | ICD-10-CM | POA: Diagnosis not present

## 2015-04-08 DIAGNOSIS — Z950 Presence of cardiac pacemaker: Secondary | ICD-10-CM

## 2015-04-08 DIAGNOSIS — I4891 Unspecified atrial fibrillation: Secondary | ICD-10-CM

## 2015-04-08 DIAGNOSIS — Z7901 Long term (current) use of anticoagulants: Secondary | ICD-10-CM | POA: Diagnosis not present

## 2015-04-08 DIAGNOSIS — I495 Sick sinus syndrome: Secondary | ICD-10-CM | POA: Diagnosis not present

## 2015-04-08 DIAGNOSIS — Z5181 Encounter for therapeutic drug level monitoring: Secondary | ICD-10-CM

## 2015-04-08 DIAGNOSIS — I480 Paroxysmal atrial fibrillation: Secondary | ICD-10-CM

## 2015-04-08 LAB — CUP PACEART INCLINIC DEVICE CHECK
Battery Remaining Longevity: 121.2 mo
Brady Statistic RA Percent Paced: 98 %
Brady Statistic RV Percent Paced: 0.12 %
Lead Channel Impedance Value: 412.5 Ohm
Lead Channel Impedance Value: 412.5 Ohm
Lead Channel Pacing Threshold Amplitude: 0.5 V
Lead Channel Pacing Threshold Pulse Width: 0.4 ms
Lead Channel Pacing Threshold Pulse Width: 0.4 ms
Lead Channel Sensing Intrinsic Amplitude: 2.9 mV
Lead Channel Setting Sensing Sensitivity: 2 mV
MDC IDC MSMT BATTERY VOLTAGE: 3.01 V
MDC IDC MSMT LEADCHNL RV PACING THRESHOLD AMPLITUDE: 1 V
MDC IDC MSMT LEADCHNL RV SENSING INTR AMPL: 10.1 mV
MDC IDC PG SERIAL: 7714522
MDC IDC SESS DTM: 20160524094027
MDC IDC SET LEADCHNL RA PACING AMPLITUDE: 2 V
MDC IDC SET LEADCHNL RV PACING AMPLITUDE: 2 V
MDC IDC SET LEADCHNL RV PACING PULSEWIDTH: 0.4 ms
Pulse Gen Model: 2240

## 2015-04-08 LAB — POCT INR: INR: 2.2

## 2015-04-08 NOTE — Assessment & Plan Note (Signed)
Her St. Jude DDD PM is working normally. Will recheck in several months.  

## 2015-04-08 NOTE — Assessment & Plan Note (Signed)
Her blood pressure is well controlled. She will continue her current meds. She is encouraged to reduce her sodium intake.

## 2015-04-08 NOTE — Patient Instructions (Addendum)
Medication Instructions:  Your physician recommends that you continue on your current medications as directed. Please refer to the Current Medication list given to you today.   Labwork: NONE  Testing/Procedures: NONE  Follow-Up: Remote monitoring is used to monitor your Pacemaker or ICD from home. This monitoring reduces the number of office visits required to check your device to one time per year. It allows us to keep an eye on the functioning of your device to ensure it is working properly. You are scheduled for a device check from home on 07/08/2015. You may send your transmission at any time that day. If you have a wireless device, the transmission will be sent automatically. After your physician reviews your transmission, you will receive a postcard with your next transmission date.  Your physician wants you to follow-up in: 9 months with Dr. Taylor. You will receive a reminder letter in the mail two months in advance. If you don't receive a letter, please call our office to schedule the follow-up appointment.   Any Other Special Instructions Will Be Listed Below (If Applicable).   

## 2015-04-08 NOTE — Assessment & Plan Note (Signed)
She is maintaining NSR. She will continue low dose coumadin.

## 2015-04-08 NOTE — Progress Notes (Signed)
HPI [name] [name] returns today for followup. She is a 81 year old woman with a history of symptomatic bradycardia, status post permanent pacemaker insertion, paroxysmal atrial fibrillation, and hypertension. She remains on Coumadin. In the interim, she has been stable. She denies chest pain. No falls. She thinks that her weight has stabilized. She has undergone PM generator change and done well. No other complaints except for mild ankle swelling.  No Known Allergies   Current Outpatient Prescriptions  Medication Sig Dispense Refill  . bimatoprost (LUMIGAN) 0.03 % ophthalmic solution Place 1 drop into both eyes daily.      . dorzolamide (TRUSOPT) 2 % ophthalmic solution Place 1 drop into both eyes 2 (two) times daily.      . Multiple Vitamins-Minerals (MULTIVITAL) tablet Take 1 tablet by mouth daily.      Marland Kitchen. triamterene-hydrochlorothiazide (MAXZIDE-25) 37.5-25 MG per tablet TAKE ONE (1) TABLET EACH DAY 30 tablet 11  . valsartan-hydrochlorothiazide (DIOVAN-HCT) 320-12.5 MG per tablet Take 1 tablet by mouth daily.    Marland Kitchen. warfarin (COUMADIN) 3 MG tablet TAKE AS DIRECTED BY COUMADIN CLINIC 30 tablet 3   No current facility-administered medications for this visit.     Past Medical History  Diagnosis Date  . Cardiac dysrhythmia, unspecified   . HTN (hypertension)   . Cardiac pacemaker in situ   . Syncope   . Bradycardia     ROS:   All systems reviewed and negative except as noted in the HPI.   Past Surgical History  Procedure Laterality Date  . Implantation of dual chamber pacemaker  05/09/08    St Jude dual-chamber. Dr. Ladona Ridgelaylor   . Permanent pacemaker generator change N/A 12/20/2014    Procedure: PERMANENT PACEMAKER GENERATOR CHANGE;  Surgeon: Marinus MawGregg W Taylor, MD;  [facility];  Service: Cardiovascular;  Laterality: N/A;     History reviewed. No pertinent family history.   History   Social History  . Marital Status: Married    Spouse Name: N/A  . Number of Children: N/A  .  Years of Education: N/A   Occupational History  . Not on file.   Social History Main Topics  . Smoking status: Never Smoker   . Smokeless tobacco: Not on file  . Alcohol Use: No  . Drug Use: No  . Sexual Activity: Not on file   Other Topics Concern  . Not on file   Social History Narrative     BP 148/82 mmHg  Pulse 67  Ht 5\' 2"  (1.575 m)  Wt 107 lb (48.535 kg)  BMI 19.57 kg/m2  SpO2 99%  Physical Exam:  frail appearing elderly woman,NAD HEENT: Unremarkable Neck:  No JVD, no thyromegally Back:  No CVA tenderness Lungs:  Clear with no wheezes, rales, or rhonchi. HEART:  Regular rate rhythm, no murmurs, no rubs, no clicks Abd:  soft, positive bowel sounds, no organomegally, no rebound, no guarding Ext:  2 plus pulses, no edema, no cyanosis, no clubbing Skin:  No rashes no nodules Neuro:  CN II through XII intact, motor grossly intact  DEVICE  Normal device function.  See PaceArt for details.   Assess/Plan:

## 2015-04-24 ENCOUNTER — Encounter: Payer: Self-pay | Admitting: Internal Medicine

## 2015-05-10 ENCOUNTER — Other Ambulatory Visit: Payer: Self-pay | Admitting: Internal Medicine

## 2015-05-20 ENCOUNTER — Ambulatory Visit (INDEPENDENT_AMBULATORY_CARE_PROVIDER_SITE_OTHER): Payer: Medicare PPO | Admitting: *Deleted

## 2015-05-20 DIAGNOSIS — Z5181 Encounter for therapeutic drug level monitoring: Secondary | ICD-10-CM

## 2015-05-20 DIAGNOSIS — Z7901 Long term (current) use of anticoagulants: Secondary | ICD-10-CM

## 2015-05-20 DIAGNOSIS — I4891 Unspecified atrial fibrillation: Secondary | ICD-10-CM | POA: Diagnosis not present

## 2015-05-20 LAB — POCT INR: INR: 1.7

## 2015-06-10 ENCOUNTER — Ambulatory Visit (INDEPENDENT_AMBULATORY_CARE_PROVIDER_SITE_OTHER): Payer: Medicare PPO | Admitting: *Deleted

## 2015-06-10 DIAGNOSIS — I4891 Unspecified atrial fibrillation: Secondary | ICD-10-CM | POA: Diagnosis not present

## 2015-06-10 DIAGNOSIS — Z7901 Long term (current) use of anticoagulants: Secondary | ICD-10-CM | POA: Diagnosis not present

## 2015-06-10 DIAGNOSIS — Z5181 Encounter for therapeutic drug level monitoring: Secondary | ICD-10-CM

## 2015-06-10 LAB — POCT INR: INR: 2.4

## 2015-07-08 ENCOUNTER — Ambulatory Visit (INDEPENDENT_AMBULATORY_CARE_PROVIDER_SITE_OTHER): Payer: Medicare PPO | Admitting: *Deleted

## 2015-07-08 DIAGNOSIS — Z7901 Long term (current) use of anticoagulants: Secondary | ICD-10-CM

## 2015-07-08 DIAGNOSIS — Z5181 Encounter for therapeutic drug level monitoring: Secondary | ICD-10-CM | POA: Diagnosis not present

## 2015-07-08 DIAGNOSIS — I4891 Unspecified atrial fibrillation: Secondary | ICD-10-CM

## 2015-07-08 DIAGNOSIS — I495 Sick sinus syndrome: Secondary | ICD-10-CM | POA: Diagnosis not present

## 2015-07-08 LAB — POCT INR: INR: 1.7

## 2015-07-08 NOTE — Progress Notes (Signed)
Remote pacemaker transmission.   

## 2015-07-18 LAB — CUP PACEART REMOTE DEVICE CHECK
Brady Statistic AS VP Percent: 1 %
Brady Statistic RA Percent Paced: 98 %
Brady Statistic RV Percent Paced: 1 %
Date Time Interrogation Session: 20160823060027
Lead Channel Pacing Threshold Pulse Width: 0.4 ms
Lead Channel Pacing Threshold Pulse Width: 0.4 ms
Lead Channel Sensing Intrinsic Amplitude: 10.2 mV
Lead Channel Sensing Intrinsic Amplitude: 2.3 mV
Lead Channel Setting Pacing Amplitude: 2 V
Lead Channel Setting Sensing Sensitivity: 2 mV
MDC IDC MSMT BATTERY REMAINING LONGEVITY: 114 mo
MDC IDC MSMT BATTERY REMAINING PERCENTAGE: 95.5 %
MDC IDC MSMT BATTERY VOLTAGE: 3.01 V
MDC IDC MSMT LEADCHNL RA IMPEDANCE VALUE: 400 Ohm
MDC IDC MSMT LEADCHNL RA PACING THRESHOLD AMPLITUDE: 0.5 V
MDC IDC MSMT LEADCHNL RV IMPEDANCE VALUE: 410 Ohm
MDC IDC MSMT LEADCHNL RV PACING THRESHOLD AMPLITUDE: 1 V
MDC IDC SET LEADCHNL RV PACING AMPLITUDE: 2 V
MDC IDC SET LEADCHNL RV PACING PULSEWIDTH: 0.4 ms
MDC IDC STAT BRADY AP VP PERCENT: 1 %
MDC IDC STAT BRADY AP VS PERCENT: 98 %
MDC IDC STAT BRADY AS VS PERCENT: 2.2 %
Pulse Gen Model: 2240
Pulse Gen Serial Number: 7714522

## 2015-07-24 ENCOUNTER — Ambulatory Visit (INDEPENDENT_AMBULATORY_CARE_PROVIDER_SITE_OTHER): Payer: Medicare PPO | Admitting: *Deleted

## 2015-07-24 DIAGNOSIS — Z5181 Encounter for therapeutic drug level monitoring: Secondary | ICD-10-CM

## 2015-07-24 DIAGNOSIS — Z7901 Long term (current) use of anticoagulants: Secondary | ICD-10-CM | POA: Diagnosis not present

## 2015-07-24 DIAGNOSIS — I4891 Unspecified atrial fibrillation: Secondary | ICD-10-CM | POA: Diagnosis not present

## 2015-07-24 LAB — POCT INR: INR: 2.9

## 2015-07-30 ENCOUNTER — Encounter: Payer: Self-pay | Admitting: Cardiology

## 2015-08-06 ENCOUNTER — Encounter: Payer: Self-pay | Admitting: Internal Medicine

## 2015-08-14 ENCOUNTER — Ambulatory Visit (INDEPENDENT_AMBULATORY_CARE_PROVIDER_SITE_OTHER): Payer: Medicare PPO | Admitting: *Deleted

## 2015-08-14 DIAGNOSIS — Z7901 Long term (current) use of anticoagulants: Secondary | ICD-10-CM

## 2015-08-14 DIAGNOSIS — I4891 Unspecified atrial fibrillation: Secondary | ICD-10-CM | POA: Diagnosis not present

## 2015-08-14 DIAGNOSIS — Z5181 Encounter for therapeutic drug level monitoring: Secondary | ICD-10-CM | POA: Diagnosis not present

## 2015-08-14 LAB — POCT INR: INR: 2.9

## 2015-09-11 ENCOUNTER — Other Ambulatory Visit: Payer: Self-pay | Admitting: Internal Medicine

## 2015-09-11 ENCOUNTER — Ambulatory Visit (INDEPENDENT_AMBULATORY_CARE_PROVIDER_SITE_OTHER): Payer: Medicare PPO | Admitting: Pharmacist

## 2015-09-11 DIAGNOSIS — I4891 Unspecified atrial fibrillation: Secondary | ICD-10-CM | POA: Diagnosis not present

## 2015-09-11 DIAGNOSIS — Z5181 Encounter for therapeutic drug level monitoring: Secondary | ICD-10-CM | POA: Diagnosis not present

## 2015-09-11 DIAGNOSIS — Z7901 Long term (current) use of anticoagulants: Secondary | ICD-10-CM

## 2015-09-11 LAB — POCT INR: INR: 2.7

## 2015-10-01 ENCOUNTER — Other Ambulatory Visit: Payer: Self-pay | Admitting: Internal Medicine

## 2015-10-13 ENCOUNTER — Ambulatory Visit (INDEPENDENT_AMBULATORY_CARE_PROVIDER_SITE_OTHER): Payer: Medicare PPO | Admitting: *Deleted

## 2015-10-13 DIAGNOSIS — I495 Sick sinus syndrome: Secondary | ICD-10-CM

## 2015-10-13 NOTE — Progress Notes (Signed)
Remote pacemaker transmission.   

## 2015-10-20 LAB — CUP PACEART REMOTE DEVICE CHECK
Battery Remaining Longevity: 111 mo
Battery Remaining Percentage: 95.5 %
Battery Voltage: 3.01 V
Brady Statistic AS VP Percent: 1 %
Brady Statistic RA Percent Paced: 97 %
Implantable Lead Implant Date: 20090625
Implantable Lead Location: 753859
Implantable Lead Location: 753860
Lead Channel Impedance Value: 390 Ohm
Lead Channel Pacing Threshold Amplitude: 0.5 V
Lead Channel Pacing Threshold Pulse Width: 0.4 ms
Lead Channel Sensing Intrinsic Amplitude: 2.2 mV
Lead Channel Sensing Intrinsic Amplitude: 7.5 mV
Lead Channel Setting Pacing Amplitude: 2 V
Lead Channel Setting Pacing Amplitude: 2 V
Lead Channel Setting Sensing Sensitivity: 2 mV
MDC IDC LEAD IMPLANT DT: 20090625
MDC IDC MSMT LEADCHNL RA IMPEDANCE VALUE: 390 Ohm
MDC IDC MSMT LEADCHNL RV PACING THRESHOLD AMPLITUDE: 1 V
MDC IDC MSMT LEADCHNL RV PACING THRESHOLD PULSEWIDTH: 0.4 ms
MDC IDC PG SERIAL: 7714522
MDC IDC SESS DTM: 20161125070011
MDC IDC SET LEADCHNL RV PACING PULSEWIDTH: 0.4 ms
MDC IDC STAT BRADY AP VP PERCENT: 1 %
MDC IDC STAT BRADY AP VS PERCENT: 97 %
MDC IDC STAT BRADY AS VS PERCENT: 2.5 %
MDC IDC STAT BRADY RV PERCENT PACED: 1 %

## 2015-10-21 ENCOUNTER — Encounter: Payer: Self-pay | Admitting: Cardiology

## 2015-10-23 ENCOUNTER — Ambulatory Visit (INDEPENDENT_AMBULATORY_CARE_PROVIDER_SITE_OTHER): Payer: Medicare PPO | Admitting: Pharmacist

## 2015-10-23 DIAGNOSIS — I4891 Unspecified atrial fibrillation: Secondary | ICD-10-CM

## 2015-10-23 DIAGNOSIS — Z7901 Long term (current) use of anticoagulants: Secondary | ICD-10-CM | POA: Diagnosis not present

## 2015-10-23 DIAGNOSIS — Z5181 Encounter for therapeutic drug level monitoring: Secondary | ICD-10-CM

## 2015-10-23 LAB — POCT INR: INR: 3.4

## 2015-11-20 ENCOUNTER — Ambulatory Visit (INDEPENDENT_AMBULATORY_CARE_PROVIDER_SITE_OTHER): Payer: Medicare Other | Admitting: Pharmacist

## 2015-11-20 DIAGNOSIS — Z5181 Encounter for therapeutic drug level monitoring: Secondary | ICD-10-CM

## 2015-11-20 DIAGNOSIS — Z7901 Long term (current) use of anticoagulants: Secondary | ICD-10-CM | POA: Diagnosis not present

## 2015-11-20 DIAGNOSIS — I4891 Unspecified atrial fibrillation: Secondary | ICD-10-CM

## 2015-11-20 LAB — POCT INR: INR: 3.7

## 2015-12-04 ENCOUNTER — Ambulatory Visit (INDEPENDENT_AMBULATORY_CARE_PROVIDER_SITE_OTHER): Payer: Medicare Other | Admitting: *Deleted

## 2015-12-04 DIAGNOSIS — Z7901 Long term (current) use of anticoagulants: Secondary | ICD-10-CM

## 2015-12-04 DIAGNOSIS — I4891 Unspecified atrial fibrillation: Secondary | ICD-10-CM

## 2015-12-04 DIAGNOSIS — Z5181 Encounter for therapeutic drug level monitoring: Secondary | ICD-10-CM

## 2015-12-04 LAB — POCT INR: INR: 2.5

## 2015-12-20 ENCOUNTER — Inpatient Hospital Stay (HOSPITAL_COMMUNITY)
Admission: EM | Admit: 2015-12-20 | Discharge: 2016-01-14 | DRG: 377 | Disposition: E | Payer: Medicare Other | Attending: Pulmonary Disease | Admitting: Pulmonary Disease

## 2015-12-20 ENCOUNTER — Inpatient Hospital Stay (HOSPITAL_COMMUNITY): Payer: Medicare Other

## 2015-12-20 ENCOUNTER — Encounter (HOSPITAL_COMMUNITY): Payer: Self-pay | Admitting: *Deleted

## 2015-12-20 DIAGNOSIS — K922 Gastrointestinal hemorrhage, unspecified: Secondary | ICD-10-CM | POA: Diagnosis present

## 2015-12-20 DIAGNOSIS — R109 Unspecified abdominal pain: Secondary | ICD-10-CM

## 2015-12-20 DIAGNOSIS — R791 Abnormal coagulation profile: Secondary | ICD-10-CM | POA: Diagnosis present

## 2015-12-20 DIAGNOSIS — Z950 Presence of cardiac pacemaker: Secondary | ICD-10-CM | POA: Diagnosis not present

## 2015-12-20 DIAGNOSIS — K2960 Other gastritis without bleeding: Secondary | ICD-10-CM | POA: Diagnosis present

## 2015-12-20 DIAGNOSIS — E860 Dehydration: Secondary | ICD-10-CM | POA: Diagnosis present

## 2015-12-20 DIAGNOSIS — T45515A Adverse effect of anticoagulants, initial encounter: Secondary | ICD-10-CM | POA: Diagnosis present

## 2015-12-20 DIAGNOSIS — Z515 Encounter for palliative care: Secondary | ICD-10-CM | POA: Diagnosis present

## 2015-12-20 DIAGNOSIS — K5669 Other intestinal obstruction: Secondary | ICD-10-CM | POA: Diagnosis not present

## 2015-12-20 DIAGNOSIS — E872 Acidosis, unspecified: Secondary | ICD-10-CM

## 2015-12-20 DIAGNOSIS — N179 Acute kidney failure, unspecified: Secondary | ICD-10-CM | POA: Diagnosis present

## 2015-12-20 DIAGNOSIS — R739 Hyperglycemia, unspecified: Secondary | ICD-10-CM | POA: Diagnosis present

## 2015-12-20 DIAGNOSIS — D62 Acute posthemorrhagic anemia: Secondary | ICD-10-CM | POA: Diagnosis present

## 2015-12-20 DIAGNOSIS — E162 Hypoglycemia, unspecified: Secondary | ICD-10-CM | POA: Diagnosis not present

## 2015-12-20 DIAGNOSIS — K559 Vascular disorder of intestine, unspecified: Secondary | ICD-10-CM | POA: Diagnosis present

## 2015-12-20 DIAGNOSIS — I10 Essential (primary) hypertension: Secondary | ICD-10-CM | POA: Diagnosis not present

## 2015-12-20 DIAGNOSIS — K5660 Unspecified intestinal obstruction: Secondary | ICD-10-CM | POA: Diagnosis present

## 2015-12-20 DIAGNOSIS — J9601 Acute respiratory failure with hypoxia: Secondary | ICD-10-CM | POA: Diagnosis not present

## 2015-12-20 DIAGNOSIS — G9340 Encephalopathy, unspecified: Secondary | ICD-10-CM | POA: Diagnosis present

## 2015-12-20 DIAGNOSIS — N289 Disorder of kidney and ureter, unspecified: Secondary | ICD-10-CM | POA: Diagnosis not present

## 2015-12-20 DIAGNOSIS — A419 Sepsis, unspecified organism: Secondary | ICD-10-CM | POA: Diagnosis not present

## 2015-12-20 DIAGNOSIS — Z7901 Long term (current) use of anticoagulants: Secondary | ICD-10-CM

## 2015-12-20 DIAGNOSIS — I248 Other forms of acute ischemic heart disease: Secondary | ICD-10-CM | POA: Diagnosis present

## 2015-12-20 DIAGNOSIS — I480 Paroxysmal atrial fibrillation: Secondary | ICD-10-CM | POA: Diagnosis present

## 2015-12-20 DIAGNOSIS — J9690 Respiratory failure, unspecified, unspecified whether with hypoxia or hypercapnia: Secondary | ICD-10-CM

## 2015-12-20 DIAGNOSIS — I959 Hypotension, unspecified: Secondary | ICD-10-CM | POA: Diagnosis present

## 2015-12-20 DIAGNOSIS — R1013 Epigastric pain: Secondary | ICD-10-CM

## 2015-12-20 DIAGNOSIS — K449 Diaphragmatic hernia without obstruction or gangrene: Secondary | ICD-10-CM | POA: Diagnosis present

## 2015-12-20 DIAGNOSIS — J90 Pleural effusion, not elsewhere classified: Secondary | ICD-10-CM | POA: Diagnosis not present

## 2015-12-20 DIAGNOSIS — K921 Melena: Principal | ICD-10-CM | POA: Diagnosis present

## 2015-12-20 DIAGNOSIS — T45511A Poisoning by anticoagulants, accidental (unintentional), initial encounter: Secondary | ICD-10-CM

## 2015-12-20 DIAGNOSIS — R6521 Severe sepsis with septic shock: Secondary | ICD-10-CM | POA: Diagnosis not present

## 2015-12-20 DIAGNOSIS — D689 Coagulation defect, unspecified: Secondary | ICD-10-CM

## 2015-12-20 DIAGNOSIS — Z79899 Other long term (current) drug therapy: Secondary | ICD-10-CM | POA: Diagnosis not present

## 2015-12-20 DIAGNOSIS — E874 Mixed disorder of acid-base balance: Secondary | ICD-10-CM | POA: Diagnosis present

## 2015-12-20 DIAGNOSIS — I2510 Atherosclerotic heart disease of native coronary artery without angina pectoris: Secondary | ICD-10-CM | POA: Diagnosis present

## 2015-12-20 DIAGNOSIS — K56609 Unspecified intestinal obstruction, unspecified as to partial versus complete obstruction: Secondary | ICD-10-CM

## 2015-12-20 DIAGNOSIS — R5383 Other fatigue: Secondary | ICD-10-CM | POA: Diagnosis not present

## 2015-12-20 DIAGNOSIS — I4891 Unspecified atrial fibrillation: Secondary | ICD-10-CM | POA: Diagnosis present

## 2015-12-20 DIAGNOSIS — K259 Gastric ulcer, unspecified as acute or chronic, without hemorrhage or perforation: Secondary | ICD-10-CM | POA: Diagnosis present

## 2015-12-20 DIAGNOSIS — D6832 Hemorrhagic disorder due to extrinsic circulating anticoagulants: Secondary | ICD-10-CM | POA: Diagnosis present

## 2015-12-20 DIAGNOSIS — J9600 Acute respiratory failure, unspecified whether with hypoxia or hypercapnia: Secondary | ICD-10-CM

## 2015-12-20 LAB — COMPREHENSIVE METABOLIC PANEL
ALBUMIN: 4.1 g/dL (ref 3.5–5.0)
ALT: 11 U/L — AB (ref 14–54)
ANION GAP: 15 (ref 5–15)
AST: 36 U/L (ref 15–41)
Alkaline Phosphatase: 48 U/L (ref 38–126)
BILIRUBIN TOTAL: 0.9 mg/dL (ref 0.3–1.2)
BUN: 43 mg/dL — ABNORMAL HIGH (ref 6–20)
CALCIUM: 9.7 mg/dL (ref 8.9–10.3)
CO2: 19 mmol/L — ABNORMAL LOW (ref 22–32)
Chloride: 106 mmol/L (ref 101–111)
Creatinine, Ser: 1.6 mg/dL — ABNORMAL HIGH (ref 0.44–1.00)
GFR calc Af Amer: 31 mL/min — ABNORMAL LOW (ref >60)
GFR calc non Af Amer: 27 mL/min — ABNORMAL LOW (ref >60)
Glucose, Bld: 250 mg/dL — ABNORMAL HIGH (ref 65–99)
Potassium: 4.1 mmol/L (ref 3.5–5.1)
Sodium: 140 mmol/L (ref 135–145)
TOTAL PROTEIN: 7.5 g/dL (ref 6.5–8.1)

## 2015-12-20 LAB — CBC
HCT: 34.4 % — ABNORMAL LOW (ref 36.0–46.0)
HEMATOCRIT: 34.1 % — AB (ref 36.0–46.0)
HEMOGLOBIN: 11.2 g/dL — AB (ref 12.0–15.0)
Hemoglobin: 11.6 g/dL — ABNORMAL LOW (ref 12.0–15.0)
MCH: 31.2 pg (ref 26.0–34.0)
MCH: 31.6 pg (ref 26.0–34.0)
MCHC: 32.8 g/dL (ref 30.0–36.0)
MCHC: 33.7 g/dL (ref 30.0–36.0)
MCV: 95 fL (ref 78.0–100.0)
MCV: 93.7 fL (ref 78.0–100.0)
PLATELETS: 128 10*3/uL — AB (ref 150–400)
Platelets: 151 10*3/uL (ref 150–400)
RBC: 3.59 MIL/uL — AB (ref 3.87–5.11)
RBC: 3.67 MIL/uL — ABNORMAL LOW (ref 3.87–5.11)
RDW: 13 % (ref 11.5–15.5)
RDW: 13.1 % (ref 11.5–15.5)
WBC: 6 10*3/uL (ref 4.0–10.5)
WBC: 5.3 10*3/uL (ref 4.0–10.5)

## 2015-12-20 LAB — TROPONIN I
TROPONIN I: 0.07 ng/mL — AB (ref <0.031)
TROPONIN I: 0.11 ng/mL — AB (ref <0.031)

## 2015-12-20 LAB — GLUCOSE, CAPILLARY
GLUCOSE-CAPILLARY: 147 mg/dL — AB (ref 65–99)
Glucose-Capillary: 131 mg/dL — ABNORMAL HIGH (ref 65–99)

## 2015-12-20 LAB — MAGNESIUM: Magnesium: 2.2 mg/dL (ref 1.7–2.4)

## 2015-12-20 LAB — POC OCCULT BLOOD, ED: Fecal Occult Bld: POSITIVE — AB

## 2015-12-20 LAB — MRSA PCR SCREENING: MRSA BY PCR: NEGATIVE

## 2015-12-20 LAB — ABO/RH: ABO/RH(D): O POS

## 2015-12-20 LAB — TYPE AND SCREEN
ABO/RH(D): O POS
Antibody Screen: NEGATIVE

## 2015-12-20 LAB — PROTIME-INR
INR: 1.9 — ABNORMAL HIGH (ref 0.00–1.49)
Prothrombin Time: 21.7 seconds — ABNORMAL HIGH (ref 11.6–15.2)

## 2015-12-20 MED ORDER — SODIUM CHLORIDE 0.9 % IV SOLN: Freq: Once | INTRAVENOUS | Status: AC

## 2015-12-20 MED ORDER — SODIUM CHLORIDE 0.9 % IV BOLUS (SEPSIS)
500.0000 mL | Freq: Once | INTRAVENOUS | Status: AC
  Administered 2015-12-20: 500 mL via INTRAVENOUS

## 2015-12-20 MED ORDER — LATANOPROST 0.005 % OP SOLN
1.0000 [drp] | Freq: Every day | OPHTHALMIC | Status: DC
  Administered 2015-12-20 – 2015-12-26 (×7): 1 [drp] via OPHTHALMIC
  Filled 2015-12-20 (×2): qty 2.5

## 2015-12-20 MED ORDER — SODIUM CHLORIDE 0.9% FLUSH
3.0000 mL | Freq: Two times a day (BID) | INTRAVENOUS | Status: DC
  Administered 2015-12-20 – 2015-12-26 (×7): 3 mL via INTRAVENOUS

## 2015-12-20 MED ORDER — PANTOPRAZOLE SODIUM 40 MG IV SOLR: 40.0000 mg | Freq: Two times a day (BID) | INTRAVENOUS | Status: DC

## 2015-12-20 MED ORDER — ONDANSETRON HCL 4 MG/2ML IJ SOLN
4.0000 mg | Freq: Four times a day (QID) | INTRAMUSCULAR | Status: DC | PRN
  Administered 2015-12-20: 4 mg via INTRAVENOUS
  Filled 2015-12-20: qty 2

## 2015-12-20 MED ORDER — SODIUM CHLORIDE 0.9 % IV SOLN: INTRAVENOUS | Status: DC

## 2015-12-20 MED ORDER — SODIUM CHLORIDE 0.9 % IV SOLN
INTRAVENOUS | Status: DC
  Administered 2015-12-20: 22:00:00 via INTRAVENOUS

## 2015-12-20 MED ORDER — ACETAMINOPHEN 650 MG RE SUPP: 650.0000 mg | Freq: Four times a day (QID) | RECTAL | Status: DC | PRN

## 2015-12-20 MED ORDER — VITAMIN K1 10 MG/ML IJ SOLN
5.0000 mg | INTRAVENOUS | Status: AC
  Administered 2015-12-20: 5 mg via INTRAVENOUS
  Filled 2015-12-20: qty 0.5

## 2015-12-20 MED ORDER — INSULIN ASPART 100 UNIT/ML ~~LOC~~ SOLN
0.0000 [IU] | Freq: Three times a day (TID) | SUBCUTANEOUS | Status: DC
  Administered 2015-12-20 – 2015-12-22 (×2): 1 [IU] via SUBCUTANEOUS
  Administered 2015-12-23 – 2015-12-24 (×3): 2 [IU] via SUBCUTANEOUS

## 2015-12-20 MED ORDER — ACETAMINOPHEN 325 MG PO TABS
650.0000 mg | ORAL_TABLET | Freq: Four times a day (QID) | ORAL | Status: DC | PRN
  Administered 2015-12-20: 650 mg via ORAL
  Filled 2015-12-20: qty 2

## 2015-12-20 MED ORDER — ONDANSETRON HCL 4 MG PO TABS: 4.0000 mg | ORAL_TABLET | Freq: Four times a day (QID) | ORAL | Status: DC | PRN

## 2015-12-20 MED ORDER — DORZOLAMIDE HCL 2 % OP SOLN
1.0000 [drp] | Freq: Two times a day (BID) | OPHTHALMIC | Status: DC
  Administered 2015-12-20 – 2015-12-26 (×13): 1 [drp] via OPHTHALMIC
  Filled 2015-12-20 (×2): qty 10

## 2015-12-20 MED ORDER — HYDRALAZINE HCL 20 MG/ML IJ SOLN
10.0000 mg | Freq: Four times a day (QID) | INTRAMUSCULAR | Status: DC | PRN
  Administered 2015-12-20: 10 mg via INTRAVENOUS
  Filled 2015-12-20: qty 1

## 2015-12-20 MED ORDER — SODIUM CHLORIDE 0.9 % IV SOLN
8.0000 mg/h | INTRAVENOUS | Status: DC
  Administered 2015-12-20 – 2015-12-21 (×2): 8 mg/h via INTRAVENOUS
  Filled 2015-12-20 (×4): qty 80

## 2015-12-20 MED ORDER — SODIUM CHLORIDE 0.9 % IV SOLN
INTRAVENOUS | Status: DC
  Administered 2015-12-24: 23:00:00 via INTRAVENOUS

## 2015-12-20 MED ORDER — SODIUM CHLORIDE 0.9 % IV SOLN
INTRAVENOUS | Status: DC
  Administered 2015-12-20 – 2015-12-23 (×4): via INTRAVENOUS

## 2015-12-20 MED ORDER — SODIUM CHLORIDE 0.9 % IV SOLN
80.0000 mg | Freq: Once | INTRAVENOUS | Status: AC
  Administered 2015-12-20: 80 mg via INTRAVENOUS
  Filled 2015-12-20: qty 80

## 2015-12-20 NOTE — ED Notes (Signed)
Bed: ZO10 Expected date: 01/13/2016 Expected time: 12:23 PM Means of arrival: Ambulance Comments: 82 yo F rectal bleeding, on blood thinners, VSS

## 2015-12-20 NOTE — H&P (Addendum)
[facility] History and Physical  [name] [name] WUJ:[id number] DOB: [birth year] DOA: 12/25/2015  Referring physician:  PCP: Pola Corn, MD   Chief Complaint: GI bleeding   HPI:  This is a 82 year old female with a history of symptomatic bradycardia status post pacemaker insertion, paroxysmal atrial fibrillation on anticoagulation for the last 5 years, hypertension, who presents to the ER today due to rectal bleeding. The patient woke up this morning, after having breakfast had a regular bowel movement, followed by urgency to have a BM again associated with epigastric abdominal pain. Patient had another bowel movement that looked like black tarry stool followed by a third bowel movement that was brighter in color. Patient has never had a colonoscopy in the past. INR 1.9 today. She denies any past history of peptic ulcer disease, does not take any aspirin, NSAIDs, hemodynamically stable in the ER. She's not had any recurrent bleeding in the ER today. Hemoglobin around 11.2 which is close to her baseline. Creatinine has gone up from baseline of 1.2-1.6, BUN 43. Patient is not tachycardic or hypotensive, has received a total of 1 L normal saline bolus. She was also given 5 mg of IV vitamin K and was initiated on Protonix IV. Patient is being admitted to step down for rectal bleeding      Review of Systems: negative for the following  Constitutional: Denies fever, chills, diaphoresis, appetite change and fatigue.  HEENT: Denies photophobia, eye pain, redness, hearing loss, ear pain, congestion, sore throat, rhinorrhea, sneezing, mouth sores, trouble swallowing, neck pain, neck stiffness and tinnitus.  Respiratory: Denies SOB, DOE, cough, chest tightness, and wheezing.  Cardiovascular: Denies chest pain, palpitations and leg swelling.  Gastrointestinal: Denies nausea, vomiting, abdominal pain, diarrhea, constipation, positive for blood in stool and abdomin chest x-ray chest x-ray al distention.   Genitourinary: Denies dysuria, urgency, frequency, hematuria, flank pain and difficulty urinating.  Musculoskeletal: Denies myalgias, back pain, joint swelling, arthralgias and gait problem.  Skin: Denies pallor, rash and wound.  Neurological: Denies dizziness, seizures, syncope, weakness, light-headedness, numbness and headaches.  Hematological: Denies adenopathy. Easy bruising, personal or family bleeding history  Psychiatric/Behavioral: Denies suicidal ideation, mood changes, confusion, nervousness, sleep disturbance and agitation       Past Medical History  Diagnosis Date  . Cardiac dysrhythmia, unspecified   . HTN (hypertension)   . Cardiac pacemaker in situ   . Syncope   . Bradycardia      Past Surgical History  Procedure Laterality Date  . Implantation of dual chamber pacemaker  05/09/08    St Jude dual-chamber. Dr. Ladona Ridgel   . Permanent pacemaker generator change N/A 12/20/2014    Procedure: PERMANENT PACEMAKER GENERATOR CHANGE;  Surgeon: Marinus Maw, MD;  Location: [facility] CATH LAB;  Service: Cardiovascular;  Laterality: N/A;      Social History:  reports that she has never smoked. She does not have any smokeless tobacco history on file. She reports that she does not drink alcohol or use illicit drugs.    No Known Allergies      FAMILY HISTORY  When questioned  Directly-patient reports  No family history of HTN, CVA ,DIABETES, TB, Cancer CAD, Bleeding Disorders, Sickle Cell, diabetes, anemia, asthma,   Prior to Admission medications   Medication Sig Start Date End Date Taking? Authorizing Provider  bimatoprost (LUMIGAN) 0.03 % ophthalmic solution Place 1 drop into both eyes daily.     Yes Historical Provider, MD  dorzolamide (TRUSOPT) 2 % ophthalmic solution Place 1 drop into both  eyes 2 (two) times daily.     Yes Historical Provider, MD  Multiple Vitamins-Minerals (MULTIVITAL) tablet Take 1 tablet by mouth daily.     Yes Historical Provider, MD   triamterene-hydrochlorothiazide (MAXZIDE-25) 37.5-25 MG per tablet TAKE ONE (1) TABLET EACH DAY 10/14/14  Yes Marinus Maw, MD  valsartan-hydrochlorothiazide (DIOVAN-HCT) 320-12.5 MG per tablet Take 1 tablet by mouth daily. 11/21/14  Yes Historical Provider, MD  warfarin (COUMADIN) 3 MG tablet TAKE AS DIRECTED BY COUMADIN CLINIC 09/11/15  Yes Marinus Maw, MD  triamterene-hydrochlorothiazide [facility]) 37.5-25 MG tablet TAKE ONE (1) TABLET EACH DAY Patient not taking: Reported on 12/18/2015 10/02/15   Marinus Maw, MD     Physical Exam: Filed Vitals:   12/26/2015 1246  BP: 148/96  Pulse: 75  Temp: 97.7 F (36.5 C)  TempSrc: Oral  Resp: 18  SpO2: 100%     Constitutional: Vital signs reviewed. Patient is a well-developed and well-nourished in no acute distress and cooperative with exam. Alert and oriented x3.  Head: Normocephalic and atraumatic  Ear: TM normal bilaterally  Mouth: no erythema or exudates, MMM  Eyes: PERRL, EOMI, conjunctivae normal, No scleral icterus.  Neck: Supple, Trachea midline normal ROM, No JVD, mass, thyromegaly, or carotid bruit present.  Cardiovascular: RRR, S1 normal, S2 normal, no MRG, pulses symmetric and intact bilaterally  Pulmonary/Chest: CTAB, no wheezes, rales, or rhonchi  Abdominal: Soft. Non-tender, non-distended, bowel sounds are normal, no masses, organomegaly, or guarding present.  GU:No cva tenderness. No mass felt. Small amt dark blood on rectal exam, heme pos.   Musculoskeletal: No joint deformities, erythema, or stiffness, ROM full and no nontender Ext: no edema and no cyanosis, pulses palpable bilaterally (DP and PT)  Hematology: no cervical, inginal, or axillary adenopathy.  Neurological: A&O x3, Strenght is normal and symmetric bilaterally, cranial nerve II-XII are grossly intact, no focal motor deficit, sensory intact to light touch bilaterally.  Skin: Warm, dry and intact. No rash, cyanosis, or clubbing.  Psychiatric: Normal mood  and affect. speech and behavior is normal. Judgment and thought content normal. Cognition and memory are normal.      Data Review   Micro Results No results found for this or any previous visit (from the past 240 hour(s)).  Radiology Reports No results found.   CBC  Recent Labs Lab 01/08/2016 1309  WBC 6.0  HGB 11.2*  HCT 34.1*  PLT 151  MCV 95.0  MCH 31.2  MCHC 32.8  RDW 13.0    Chemistries   Recent Labs Lab 12/26/2015 1309  NA 140  K 4.1  CL 106  CO2 19*  GLUCOSE 250*  BUN 43*  CREATININE 1.60*  CALCIUM 9.7  AST 36  ALT 11*  ALKPHOS 48  BILITOT 0.9   ------------------------------------------------------------------------------------------------------------------ CrCl cannot be calculated (Unknown ideal weight.). ------------------------------------------------------------------------------------------------------------------ No results for input(s): HGBA1C in the last 72 hours. ------------------------------------------------------------------------------------------------------------------ No results for input(s): CHOL, HDL, LDLCALC, TRIG, CHOLHDL, LDLDIRECT in the last 72 hours. ------------------------------------------------------------------------------------------------------------------ No results for input(s): TSH, T4TOTAL, T3FREE, THYROIDAB in the last 72 hours.  Invalid input(s): FREET3 ------------------------------------------------------------------------------------------------------------------ No results for input(s): VITAMINB12, FOLATE, FERRITIN, TIBC, IRON, RETICCTPCT in the last 72 hours.  Coagulation profile  Recent Labs Lab 12/22/2015 1309  INR 1.90*    No results for input(s): DDIMER in the last 72 hours.  Cardiac Enzymes No results for input(s): CKMB, TROPONINI, MYOGLOBIN in the last 168 hours.  Invalid input(s):  CK ------------------------------------------------------------------------------------------------------------------ Invalid input(s): POCBNP   CBG: No results for input(s):  GLUCAP in the last 168 hours.     EKG: Independently reviewed.    Assessment/Plan Principal Problem:   Gastrointestinal hemorrhage with melena Suspect upper GI bleeding in the setting of epigastric pain and elevated BUN Continue IV PPI Transfuse 1 unit of packed red blood cells, I suspect the hemoglobin will drop further with IV fluids Hold antihypertensive medications Discussed with Dr. Ewing Schlein, he has advised to continue with the above measures Keep the patient NPO  for anticipated procedures in the morning Please call him sooner if the patient has recurrent bleeding or melena overnight Follow INR, CBC every 8 hours Patient did receive 5 IV of vitamin K in the ER     Essential hypertension hold all antihypertensive medications  Acute kidney injury-prerenal, baseline 1.1-1.2, likely in the setting of GI bleeding, continue IV fluids    ATRIAL FIBRILLATION-currently normal sinus rhythm, currently not on any rate controlling medications at home, hold Coumadin, troponin abnormal likely secondary to demand ischemia, if continues to increase will request cardiology consultation At this point patient is not a candidate for invasive cardiac workup even if she has ongoing ischemia, in the setting of her GI bleeding     Warfarin-induced coagulopathy (HCC)-received vitamin K, continue to follow INR   Hyperglycemia-no prior history of diabetes, check hemoglobin A1c, started the patient on SSI     Code Status Orders        Start     Ordered   12/21/2015 1425  Full code   Continuous     12/26/2015 1426     Family Communication: daughter by the bedside Disposition Plan: admit   Total time spent 55 minutes.Greater than 50% of this time was spent in counseling, explanation of diagnosis, planning of further  management, and coordination of care  [facility] Pager [phone]  If 7PM-7AM, please contact night-coverage [email or web address] Password [email or web address] 01/06/2016, 3:25 PM

## 2015-12-20 NOTE — ED Notes (Signed)
Patient takes coumadin at home and report abdominal pain last night and a very dark tarry stool this morning. Patient denies any other episodes of dark or bloody stools while on coumadin.

## 2015-12-20 NOTE — ED Provider Notes (Addendum)
CSN: [id number]     Arrival date & time 12/24/2015  1232 History   First MD Initiated Contact with Patient 12/18/2015 1246     Chief Complaint  Patient presents with  . GI Bleeding     (Consider location/radiation/quality/duration/timing/severity/associated sxs/prior Treatment) The history is provided by the patient and a relative.  Patient w hx coumdin use for 'irregular heartbeat' c/o dark blood per rectum today.  Pt indicates had a couple loose bms today, and noted large amount blood in bowl. Denies hx same.  No hx diverticula, denies prior colonoscopy.  States had some mid/diffuse abd cramping pain yesterday, which was mod-severe then.  No current abd pain. No vomiting. No abd distension. Denies any other abnormal bruising or bleeding. Does feel generally weak/lightheaded when stands. No syncope. Denies fever or chills.       Past Medical History  Diagnosis Date  . Cardiac dysrhythmia, unspecified   . HTN (hypertension)   . Cardiac pacemaker in situ   . Syncope   . Bradycardia    Past Surgical History  Procedure Laterality Date  . Implantation of dual chamber pacemaker  05/09/08    [employer] Jude dual-chamber. Dr. Ladona Ridgel   . Permanent pacemaker generator change N/A 12/20/2014    Procedure: PERMANENT PACEMAKER GENERATOR CHANGE;  Surgeon: Marinus Maw, MD;  Location: [facility] CATH LAB;  Service: Cardiovascular;  Laterality: N/A;   No family history on file. Social History  Substance Use Topics  . Smoking status: Never Smoker   . Smokeless tobacco: None  . Alcohol Use: No   OB History    No data available     Review of Systems  Constitutional: Negative for fever and chills.  HENT: Negative for nosebleeds.   Eyes: Negative for redness.  Respiratory: Negative for shortness of breath.   Cardiovascular: Negative for chest pain.  Gastrointestinal: Positive for blood in stool. Negative for vomiting and abdominal pain.  Genitourinary: Negative for hematuria, flank pain and vaginal bleeding.   Musculoskeletal: Negative for back pain and neck pain.  Skin: Negative for rash.  Neurological: Negative for headaches.  Hematological: Does not bruise/bleed easily.  Psychiatric/Behavioral: Negative for confusion.      Allergies  Review of patient's allergies indicates no known allergies.  Home Medications   Prior to Admission medications   Medication Sig Start Date End Date Taking? Authorizing Provider  bimatoprost (LUMIGAN) 0.03 % ophthalmic solution Place 1 drop into both eyes daily.      Historical Provider, MD  dorzolamide (TRUSOPT) 2 % ophthalmic solution Place 1 drop into both eyes 2 (two) times daily.      Historical Provider, MD  Multiple Vitamins-Minerals (MULTIVITAL) tablet Take 1 tablet by mouth daily.      Historical Provider, MD  triamterene-hydrochlorothiazide (MAXZIDE-25) 37.5-25 MG per tablet TAKE ONE (1) TABLET EACH DAY 10/14/14   Marinus Maw, MD  triamterene-hydrochlorothiazide [facility]) 37.5-25 MG tablet TAKE ONE (1) TABLET EACH DAY 10/02/15   Marinus Maw, MD  valsartan-hydrochlorothiazide (DIOVAN-HCT) 320-12.5 MG per tablet Take 1 tablet by mouth daily. 11/21/14   Historical Provider, MD  warfarin (COUMADIN) 3 MG tablet TAKE AS DIRECTED BY [facility]MADIN CLINIC 09/11/15   Marinus Maw, MD   BP 148/96 mmHg  Pulse 75  Temp(Src) 97.7 F (36.5 C) (Oral)  Resp 18  SpO2 100% Physical Exam  Constitutional: She appears well-developed and well-nourished. No distress.  HENT:  Mouth/Throat: Oropharynx is clear and moist.  Eyes: Conjunctivae are normal. No scleral icterus.  Neck: Neck  supple. No tracheal deviation present.  Cardiovascular: Normal rate, normal heart sounds and intact distal pulses.   Pulmonary/Chest: Effort normal and breath sounds normal. No respiratory distress.  Abdominal: Soft. Normal appearance and bowel sounds are normal. She exhibits no distension and no mass. There is no tenderness. There is no rebound and no guarding.  Genitourinary:  No  cva tenderness. No mass felt. Small amt dark blood on rectal exam, heme pos.   Musculoskeletal: She exhibits no edema.  Neurological: She is alert.  Skin: Skin is warm and dry. No rash noted. She is not diaphoretic.  Psychiatric: She has a normal mood and affect.  Nursing note and vitals reviewed.   ED Course  Procedures (including critical care time) Labs Review   Results for orders placed or performed during the hospital encounter of 12-26-15  Protime-INR  Result Value Ref Range   Prothrombin Time 21.7 (H) 11.6 - 15.2 seconds   INR 1.90 (H) 0.00 - 1.49  CBC  Result Value Ref Range   WBC 6.0 4.0 - 10.5 K/uL   RBC 3.59 (L) 3.87 - 5.11 MIL/uL   Hemoglobin 11.2 (L) 12.0 - 15.0 g/dL   HCT 78.2 (L) 95.6 - 21.3 %   MCV 95.0 78.0 - 100.0 fL   MCH 31.2 26.0 - 34.0 pg   MCHC 32.8 30.0 - 36.0 g/dL   RDW 08.6 57.8 - 46.9 %   Platelets 151 150 - 400 K/uL  Comprehensive metabolic panel  Result Value Ref Range   Sodium 140 135 - 145 mmol/L   Potassium 4.1 3.5 - 5.1 mmol/L   Chloride 106 101 - 111 mmol/L   CO2 19 (L) 22 - 32 mmol/L   Glucose, Bld 250 (H) 65 - 99 mg/dL   BUN 43 (H) 6 - 20 mg/dL   Creatinine, Ser 6.29 (H) 0.44 - 1.00 mg/dL   Calcium 9.7 8.9 - 52.8 mg/dL   Total Protein 7.5 6.5 - 8.1 g/dL   Albumin 4.1 3.5 - 5.0 g/dL   AST 36 15 - 41 U/L   ALT 11 (L) 14 - 54 U/L   Alkaline Phosphatase 48 38 - 126 U/L   Total Bilirubin 0.9 0.3 - 1.2 mg/dL   GFR calc non Af Amer 27 (L) >60 mL/min   GFR calc Af Amer 31 (L) >60 mL/min   Anion gap 15 5 - 15  POC occult blood, ED Provider will collect  Result Value Ref Range   Fecal Occult Bld POSITIVE (A) NEGATIVE      I have personally reviewed and evaluated these lab results as part of my medical decision-making.   MDM   Iv ns. Labs.  Reviewed nursing notes and prior charts for additional history.   protonix iv.  Iv ns bolus.  Labs noted, discussed w pt.   Given gi bleeding, pt symptomatic, anticoag - will give vit k  iv.   Hospitalist consulted for admission.  Hospitalists reqeusts stepdown temp orders.       Cathren Laine, MD 26-Dec-2015 [phone]

## 2015-12-21 ENCOUNTER — Encounter (HOSPITAL_COMMUNITY): Admission: EM | Disposition: E | Payer: Self-pay | Source: Home / Self Care | Attending: Pulmonary Disease

## 2015-12-21 ENCOUNTER — Encounter (HOSPITAL_COMMUNITY): Payer: Self-pay

## 2015-12-21 HISTORY — PX: ESOPHAGOGASTRODUODENOSCOPY: SHX5428

## 2015-12-21 LAB — COMPREHENSIVE METABOLIC PANEL
ALT: 10 U/L — AB (ref 14–54)
AST: 30 U/L (ref 15–41)
Albumin: 3.2 g/dL — ABNORMAL LOW (ref 3.5–5.0)
Alkaline Phosphatase: 34 U/L — ABNORMAL LOW (ref 38–126)
Anion gap: 10 (ref 5–15)
BUN: 39 mg/dL — ABNORMAL HIGH (ref 6–20)
CHLORIDE: 111 mmol/L (ref 101–111)
CO2: 19 mmol/L — AB (ref 22–32)
CREATININE: 1.53 mg/dL — AB (ref 0.44–1.00)
Calcium: 8.4 mg/dL — ABNORMAL LOW (ref 8.9–10.3)
GFR, EST AFRICAN AMERICAN: 33 mL/min — AB (ref >60)
GFR, EST NON AFRICAN AMERICAN: 28 mL/min — AB (ref >60)
Glucose, Bld: 131 mg/dL — ABNORMAL HIGH (ref 65–99)
POTASSIUM: 4.6 mmol/L (ref 3.5–5.1)
SODIUM: 140 mmol/L (ref 135–145)
Total Bilirubin: 0.6 mg/dL (ref 0.3–1.2)
Total Protein: 5.9 g/dL — ABNORMAL LOW (ref 6.5–8.1)

## 2015-12-21 LAB — TROPONIN I: TROPONIN I: 0.13 ng/mL — AB (ref <0.031)

## 2015-12-21 LAB — PROTIME-INR
INR: 1.53 — AB (ref 0.00–1.49)
PROTHROMBIN TIME: 18.4 s — AB (ref 11.6–15.2)

## 2015-12-21 LAB — HEMOGLOBIN AND HEMATOCRIT, BLOOD
HCT: 33.4 % — ABNORMAL LOW (ref 36.0–46.0)
HEMOGLOBIN: 10.9 g/dL — AB (ref 12.0–15.0)

## 2015-12-21 LAB — CBC
HEMATOCRIT: 33.8 % — AB (ref 36.0–46.0)
HEMOGLOBIN: 11.2 g/dL — AB (ref 12.0–15.0)
MCH: 31.8 pg (ref 26.0–34.0)
MCHC: 33.1 g/dL (ref 30.0–36.0)
MCV: 96 fL (ref 78.0–100.0)
Platelets: 111 10*3/uL — ABNORMAL LOW (ref 150–400)
RBC: 3.52 MIL/uL — AB (ref 3.87–5.11)
RDW: 13.4 % (ref 11.5–15.5)
WBC: 4.6 10*3/uL (ref 4.0–10.5)

## 2015-12-21 LAB — GLUCOSE, CAPILLARY
GLUCOSE-CAPILLARY: 102 mg/dL — AB (ref 65–99)
Glucose-Capillary: 102 mg/dL — ABNORMAL HIGH (ref 65–99)
Glucose-Capillary: 116 mg/dL — ABNORMAL HIGH (ref 65–99)
Glucose-Capillary: 122 mg/dL — ABNORMAL HIGH (ref 65–99)

## 2015-12-21 SURGERY — EGD (ESOPHAGOGASTRODUODENOSCOPY)
Anesthesia: Moderate Sedation

## 2015-12-21 MED ORDER — FENTANYL CITRATE (PF) 100 MCG/2ML IJ SOLN
INTRAMUSCULAR | Status: AC
  Filled 2015-12-21: qty 2

## 2015-12-21 MED ORDER — METOPROLOL TARTRATE 12.5 MG HALF TABLET
12.5000 mg | ORAL_TABLET | Freq: Two times a day (BID) | ORAL | Status: DC
  Administered 2015-12-21: 12.5 mg via ORAL
  Filled 2015-12-21: qty 1

## 2015-12-21 MED ORDER — MIDAZOLAM HCL 5 MG/ML IJ SOLN
INTRAMUSCULAR | Status: AC
  Filled 2015-12-21: qty 2

## 2015-12-21 MED ORDER — METOPROLOL TARTRATE 1 MG/ML IV SOLN: 2.5000 mg | Freq: Four times a day (QID) | INTRAVENOUS | Status: DC | PRN

## 2015-12-21 MED ORDER — MIDAZOLAM HCL 10 MG/2ML IJ SOLN
INTRAMUSCULAR | Status: DC | PRN
  Administered 2015-12-21: 2 mg via INTRAVENOUS
  Administered 2015-12-21: 1 mg via INTRAVENOUS

## 2015-12-21 MED ORDER — DILTIAZEM HCL 30 MG PO TABS: 30.0000 mg | ORAL_TABLET | Freq: Three times a day (TID) | ORAL | Status: DC

## 2015-12-21 MED ORDER — BUTAMBEN-TETRACAINE-BENZOCAINE 2-2-14 % EX AERO
INHALATION_SPRAY | CUTANEOUS | Status: DC | PRN
  Administered 2015-12-21: 2 via TOPICAL

## 2015-12-21 MED ORDER — FENTANYL CITRATE (PF) 100 MCG/2ML IJ SOLN
INTRAMUSCULAR | Status: DC | PRN
  Administered 2015-12-21 (×2): 25 ug via INTRAVENOUS

## 2015-12-21 MED ORDER — DIPHENHYDRAMINE HCL 50 MG/ML IJ SOLN
INTRAMUSCULAR | Status: AC
  Filled 2015-12-21: qty 1

## 2015-12-21 NOTE — Progress Notes (Addendum)
Patient Demographics  [name] [name], is a 82 y.o. female, DOB - 03-26-1921, ZOX:[id number]  Admit date - 12/23/2015   Admitting Physician Richarda Overlie, MD  Outpatient Primary MD for the patient is Pola Corn, MD  LOS - 1   Chief Complaint  Patient presents with  . GI Bleeding       Admission HPI/Brief narrative: 82 year old female with a history of symptomatic bradycardia status post pacemaker insertion, paroxysmal atrial fibrillation on anticoagulation for the last 5 years, hypertension, who presents to the ER today due to rectal bleeding, hemoglobin remained stable during hospital stay, did not require any transfusion, status post EGD by Dr. Ewing Schlein 12-26-15, significant for severe gastritis with ulceration, possibly ischemic. Subjective:   [name] [name] today has, No headache, No chest pain, No abdominal pain -reports nausea and abdominal pain, No Cough - SOB.No BM since admission .   Assessment & Plan    Principal Problem:   Gastrointestinal hemorrhage with melena Active Problems:   Essential hypertension   ATRIAL FIBRILLATION   Warfarin-induced coagulopathy (HCC)   GI bleed   upper GI bleed with melena  - Patient presents with melena, epigastric pain, elevated BUN, she is on warfarin with INR 1.9 on admission. - Glycohemoglobin remains stable , continue to monitor closely and transfuse as needed . - Gastroneurology consult appreciated, status post endoscopy today, significant for severe gastritis, ulceration, and debris adherence , possibly ischemic . - Continue with IV ppi , transitioned to twice a day in a.m. Marland Kitchen - Continue with liquid diet per GI recommendation  -  will need CT abdomen and pelvis .  Acute renal failure - Baseline creatinine 1.1-1.2, is 1.6 on admission, improving, continue with gentle hydration.  Atrial fibrillation -  continue to hold warfarin secondary to GI  bleed.  Elevated troponin - Most likely in the setting of demand ischemia from active GI bleed, and transient hypotension, and renal failure, denies any chest pain or shortness of breath. - even if secondary to ischemia, not a candidate for invasive work up or anticoagulation, will start on low dose BB, and recheck EKG and echo.  Warfarin-induced coagulopathy - Received vitamin K, continue to hold warfarin  Hyperglycemia - No history of diabetes mellitus, follow on hemoglobin A1c, continue with SSI  Code Status: full  Family Communication: none at bedside  Disposition Plan: pending further work up.   Procedures  Endoscopy with Dr. Ewing Schlein 26-Dec-2015   Consults   GI   Medications  Scheduled Meds: . dorzolamide  1 drop Both Eyes BID  . insulin aspart  0-9 Units Subcutaneous TID WC  . latanoprost  1 drop Both Eyes QHS  . [START ON 12/24/2015] pantoprazole (PROTONIX) IV  40 mg Intravenous Q12H  . sodium chloride flush  3 mL Intravenous Q12H   Continuous Infusions: . sodium chloride    . sodium chloride Stopped (01/13/2016 2226)   PRN Meds:.acetaminophen **OR** acetaminophen, hydrALAZINE, ondansetron **OR** ondansetron (ZOFRAN) IV  DVT Prophylaxis  SCDs  Lab Results  Component Value Date   PLT 111* 26-Dec-2015    Antibiotics    Anti-infectives    None          Objective:   Filed Vitals:   12/26/2015 0820 12/26/2015 0825 12-26-2015 0900  December 24, 2015 1004  BP: 110/43 111/44  116/58  Pulse: 73 75 73 73  Temp:   98.3 F (36.8 C)   TempSrc:   Oral   Resp: 22 22 23 20   Height:      Weight:      SpO2: 94% 95% 98% 98%    Wt Readings from Last 3 Encounters:  12/17/2015 45.1 kg (99 lb 6.8 oz)  04/08/15 48.535 kg (107 lb)  12/20/14 48.988 kg (108 lb)     Intake/Output Summary (Last 24 hours) at 12/24/2015 1115 Last data filed at December 24, 2015 1000  Gross per 24 hour  Intake 1131.75 ml  Output    100 ml  Net 1031.75 ml     Physical Exam  Awake Alert,   .AT,PERRAL Supple Neck,No JVD,  Symmetrical Chest wall movement, Good air movement bilaterally, RRR,No Gallops,Rubs or new Murmurs, No Parasternal Heave +ve B.Sounds, Abd Soft,  mild epigastric tenderness No rebound - guarding or rigidity. No Cyanosis, Clubbing or edema, No new Rash or bruise     Data Review   Micro Results Recent Results (from the past 240 hour(s))  MRSA PCR Screening     Status: None   Collection Time: 12/28/2015  3:58 PM  Result Value Ref Range Status   MRSA by PCR NEGATIVE NEGATIVE Final    Comment:        The GeneXpert MRSA Assay (FDA approved for NASAL specimens only), is one component of a comprehensive MRSA colonization surveillance program. It is not intended to diagnose MRSA infection nor to guide or monitor treatment for MRSA infections.     Radiology Reports Dg Chest Port 1 View  12/19/2015  CLINICAL DATA:  Epigastric pain EXAM: PORTABLE CHEST 1 VIEW COMPARISON:  05/10/2008 FINDINGS: Heart size upper normal. Negative for heart failure. Dual lead pacemaker unchanged with leads in the right atrium and right ventricle. Lungs are clear without infiltrate effusion or mass. No change from the prior study IMPRESSION: No active disease. Electronically Signed   By: Marlan Palau M.D.   On: 01/08/2016 16:36   Dg Abd Portable 1v  01/03/2016  CLINICAL DATA:  Epigastric pain EXAM: PORTABLE ABDOMEN - 1 VIEW COMPARISON:  None. FINDINGS: Dilated small bowel loops in the mid abdomen. There is gas in the stomach which is nondilated. Gasless colon. No acute skeletal abnormality. IMPRESSION: Dilated small bowel loops suggesting small-bowel obstruction. Electronically Signed   By: Marlan Palau M.D.   On: 12/17/2015 16:43     CBC  Recent Labs Lab 01/05/2016 1309 12/30/2015 1649 2015/12/24 0230  WBC 6.0 5.3 4.6  HGB 11.2* 11.6* 11.2*  HCT 34.1* 34.4* 33.8*  PLT 151 128* 111*  MCV 95.0 93.7 96.0  MCH 31.2 31.6 31.8  MCHC 32.8 33.7 33.1  RDW 13.0 13.1 13.4     Chemistries   Recent Labs Lab 01/06/2016 1309 Dec 24, 2015 0315  NA 140 140  K 4.1 4.6  CL 106 111  CO2 19* 19*  GLUCOSE 250* 131*  BUN 43* 39*  CREATININE 1.60* 1.53*  CALCIUM 9.7 8.4*  MG 2.2  --   AST 36 30  ALT 11* 10*  ALKPHOS 48 34*  BILITOT 0.9 0.6   ------------------------------------------------------------------------------------------------------------------ estimated creatinine clearance is 16.4 mL/min (by C-G formula based on Cr of 1.53). ------------------------------------------------------------------------------------------------------------------ No results for input(s): HGBA1C in the last 72 hours. ------------------------------------------------------------------------------------------------------------------ No results for input(s): CHOL, HDL, LDLCALC, TRIG, CHOLHDL, LDLDIRECT in the last 72 hours. ------------------------------------------------------------------------------------------------------------------ No results for input(s): TSH, T4TOTAL, T3FREE, THYROIDAB in  the last 72 hours.  Invalid input(s): FREET3 ------------------------------------------------------------------------------------------------------------------ No results for input(s): VITAMINB12, FOLATE, FERRITIN, TIBC, IRON, RETICCTPCT in the last 72 hours.  Coagulation profile  Recent Labs Lab 2015-12-29 1309 01/07/2016 0315  INR 1.90* 1.53*    No results for input(s): DDIMER in the last 72 hours.  Cardiac Enzymes  Recent Labs Lab 2015/12/29 1309 2015-12-29 2031 12/19/2015 0230  TROPONINI 0.07* 0.11* 0.13*   ------------------------------------------------------------------------------------------------------------------ Invalid input(s): POCBNP     Time Spent in minutes   30 minutes     [name] [name] M.D on 01/10/2016 at 11:15 AM  Between 7am to 7pm - Pager - [phone]  After 7pm go to [email or web address] - password [facility]  [facility]   Office   [phone]

## 2015-12-21 NOTE — Op Note (Signed)
[facility] [address] [place], [address]   ENDOSCOPY PROCEDURE REPORT  PATIENT: [name] [name], [name] [name]  MR#: [id number] BIRTHDATE: 10/27/1921 , 89  yrs. old GENDER: female ENDOSCOPIST: Vida Rigger, MD REFERRED BY: PROCEDURE DATE:  Jan 04, 2016 PROCEDURE:  EGD w/ biopsy ASA CLASS:     Class III INDICATIONS:  melena and acute post hemorrhagic anemia. MEDICATIONS: Fentanyl 50 mcg IV and Versed 3 mg IV TOPICAL ANESTHETIC: Cetacaine Spray  DESCRIPTION OF PROCEDURE: After the risks benefits and alternatives of the procedure were thoroughly explained, informed consent was obtained.  The [employer] GASTOROSCOPE [id number] endoscope was introduced through the mouth and advanced to the second portion of the duodenum , Without limitations.  The instrument was slowly withdrawn as the mucosa was fully examined. Estimated blood loss is zero unless otherwise noted in this procedure report.    the findings are recorded below       Retroflexed views revealed a hiatal hernia.     The scope was then withdrawn from the patient and the procedure completed.  COMPLICATIONS: There were no immediate complications.  ENDOSCOPIC IMPRESSION: 1. small hiatal hernia with widely patent ring    2.severe gasritis  with somesuperfial ulceration and adherent Debris a status post biopsy     looked ischemic 3. otherwisewithin normal limits to the second portionof th duodenum without signs of ctive bleedng  RECOMMENDATIONS: await biopsy continue pump inhibitors clear liquids for a day or 2- consider CT scan at some point  and will need to revaluate blood thnner needs  REPEAT EXAM:  as needed  eSigned:  Vida Rigger, MD 2016-01-04 8:23 AM    CC:  CPT CODES: ICD CODES:  The ICD and CPT codes recommended by this software are interpretations from the data that the clinical staff has captured with the software.  The verification of the translation of this report to the ICD and CPT codes and modifiers  is the sole responsibility of the health care institution and practicing physician where this report was generated.  [employer] will not be held responsible for the validity of the ICD and CPT codes included on this report.  AMA assumes no liability for data contained or not contained herein. CPT is a Publishing rights manager of the [employer].  PATIENT NAME:  [name], [name] MR#: [id number]

## 2015-12-21 NOTE — Consult Note (Signed)
Reason for Consult: GI bleeding Referring Physician:  Hospital team  ALEXIANA LAVERDURE is an 82 y.o. female.  HPI:  Patient with a negative GI history and her hospital  computer chart was reviewed and her case discussed with Hospital team  And she has not had any further bleeding in the hospiall  And her initial blood was dark ad she dd have some uppera bdominal discomfort but is feeling better today and is on Coumadin but no other blood thinners and her  Family history is negative  Past Medical History  Diagnosis Date  . Cardiac dysrhythmia, unspecified   . HTN (hypertension)   . Cardiac pacemaker in situ   . Syncope   . Bradycardia     Past Surgical History  Procedure Laterality Date  . Implantation of dual chamber pacemaker  05/09/08    St Jude dual-chamber. Dr. Lovena Le   . Permanent pacemaker generator change N/A 12/20/2014    Procedure: PERMANENT PACEMAKER GENERATOR CHANGE;  Surgeon: Evans Lance, MD;  Location: [facility] CATH LAB;  Service: Cardiovascular;  Laterality: N/A;    History reviewed. No pertinent family history.  Social History:  reports that she has never smoked. She does not have any smokeless tobacco history on file. She reports that she does not drink alcohol or use illicit drugs.  Allergies: No Known Allergies  Medications: I have reviewed the patient's current medications.  Results for orders placed or performed during the hospital encounter of 01/11/2016 (from the past 48 hour(s))  Type and screen     Status: None   Collection Time: 01/08/2016  1:09 PM  Result Value Ref Range   ABO/RH(D) O POS    Antibody Screen NEG    Sample Expiration 12/23/2015   Protime-INR     Status: Abnormal   Collection Time: 12/19/2015  1:09 PM  Result Value Ref Range   Prothrombin Time 21.7 (H) 11.6 - 15.2 seconds   INR 1.90 (H) 0.00 - 1.49  CBC     Status: Abnormal   Collection Time: 12/28/2015  1:09 PM  Result Value Ref Range   WBC 6.0 4.0 - 10.5 K/uL   RBC 3.59 (L) 3.87 - 5.11 MIL/uL   Hemoglobin 11.2 (L) 12.0 - 15.0 g/dL   HCT 34.1 (L) 36.0 - 46.0 %   MCV 95.0 78.0 - 100.0 fL   MCH 31.2 26.0 - 34.0 pg   MCHC 32.8 30.0 - 36.0 g/dL   RDW 13.0 11.5 - 15.5 %   Platelets 151 150 - 400 K/uL  Comprehensive metabolic panel     Status: Abnormal   Collection Time: 12/30/2015  1:09 PM  Result Value Ref Range   Sodium 140 135 - 145 mmol/L   Potassium 4.1 3.5 - 5.1 mmol/L   Chloride 106 101 - 111 mmol/L   CO2 19 (L) 22 - 32 mmol/L   Glucose, Bld 250 (H) 65 - 99 mg/dL   BUN 43 (H) 6 - 20 mg/dL   Creatinine, Ser 1.60 (H) 0.44 - 1.00 mg/dL   Calcium 9.7 8.9 - 10.3 mg/dL   Total Protein 7.5 6.5 - 8.1 g/dL   Albumin 4.1 3.5 - 5.0 g/dL   AST 36 15 - 41 U/L   ALT 11 (L) 14 - 54 U/L   Alkaline Phosphatase 48 38 - 126 U/L   Total Bilirubin 0.9 0.3 - 1.2 mg/dL   GFR calc non Af Amer 27 (L) >60 mL/min   GFR calc Af Amer 31 (L) >60 mL/min  Comment: (NOTE) The eGFR has been calculated using the CKD EPI equation. This calculation has not been validated in all clinical situations. eGFR's persistently <60 mL/min signify possible Chronic Kidney Disease.    Anion gap 15 5 - 15  Magnesium     Status: None   Collection Time: 01/04/2016  1:09 PM  Result Value Ref Range   Magnesium 2.2 1.7 - 2.4 mg/dL  Troponin I     Status: Abnormal   Collection Time: 01/04/2016  1:09 PM  Result Value Ref Range   Troponin I 0.07 (H) <0.031 ng/mL    Comment:        PERSISTENTLY INCREASED TROPONIN VALUES IN THE RANGE OF 0.04-0.49 ng/mL CAN BE SEEN IN:       -UNSTABLE ANGINA       -CONGESTIVE HEART FAILURE       -MYOCARDITIS       -CHEST TRAUMA       -ARRYHTHMIAS       -LATE PRESENTING MYOCARDIAL INFARCTION       -COPD   CLINICAL FOLLOW-UP RECOMMENDED.   ABO/Rh     Status: None   Collection Time: 01/07/2016  1:09 PM  Result Value Ref Range   ABO/RH(D) O POS   POC occult blood, ED Provider will collect     Status: Abnormal   Collection Time: 01/12/2016  1:30 PM  Result Value Ref Range   Fecal  Occult Bld POSITIVE (A) NEGATIVE  MRSA PCR Screening     Status: None   Collection Time: 01/03/2016  3:58 PM  Result Value Ref Range   MRSA by PCR NEGATIVE NEGATIVE    Comment:        The GeneXpert MRSA Assay (FDA approved for NASAL specimens only), is one component of a comprehensive MRSA colonization surveillance program. It is not intended to diagnose MRSA infection nor to guide or monitor treatment for MRSA infections.   CBC     Status: Abnormal   Collection Time: 12/26/2015  4:49 PM  Result Value Ref Range   WBC 5.3 4.0 - 10.5 K/uL   RBC 3.67 (L) 3.87 - 5.11 MIL/uL   Hemoglobin 11.6 (L) 12.0 - 15.0 g/dL   HCT 34.4 (L) 36.0 - 46.0 %   MCV 93.7 78.0 - 100.0 fL   MCH 31.6 26.0 - 34.0 pg   MCHC 33.7 30.0 - 36.0 g/dL   RDW 13.1 11.5 - 15.5 %   Platelets 128 (L) 150 - 400 K/uL  Glucose, capillary     Status: Abnormal   Collection Time: 01/06/2016  5:08 PM  Result Value Ref Range   Glucose-Capillary 147 (H) 65 - 99 mg/dL  Troponin I     Status: Abnormal   Collection Time: 12/23/2015  8:31 PM  Result Value Ref Range   Troponin I 0.11 (H) <0.031 ng/mL    Comment:        PERSISTENTLY INCREASED TROPONIN VALUES IN THE RANGE OF 0.04-0.49 ng/mL CAN BE SEEN IN:       -UNSTABLE ANGINA       -CONGESTIVE HEART FAILURE       -MYOCARDITIS       -CHEST TRAUMA       -ARRYHTHMIAS       -LATE PRESENTING MYOCARDIAL INFARCTION       -COPD   CLINICAL FOLLOW-UP RECOMMENDED.   Glucose, capillary     Status: Abnormal   Collection Time: 01/11/2016  9:53 PM  Result Value Ref Range   Glucose-Capillary 131 (H)  65 - 99 mg/dL  Troponin I     Status: Abnormal   Collection Time: 01/10/2016  2:30 AM  Result Value Ref Range   Troponin I 0.13 (H) <0.031 ng/mL    Comment:        PERSISTENTLY INCREASED TROPONIN VALUES IN THE RANGE OF 0.04-0.49 ng/mL CAN BE SEEN IN:       -UNSTABLE ANGINA       -CONGESTIVE HEART FAILURE       -MYOCARDITIS       -CHEST TRAUMA       -ARRYHTHMIAS       -LATE PRESENTING  MYOCARDIAL INFARCTION       -COPD   CLINICAL FOLLOW-UP RECOMMENDED.   CBC     Status: Abnormal   Collection Time: 01/02/2016  2:30 AM  Result Value Ref Range   WBC 4.6 4.0 - 10.5 K/uL   RBC 3.52 (L) 3.87 - 5.11 MIL/uL   Hemoglobin 11.2 (L) 12.0 - 15.0 g/dL   HCT 33.8 (L) 36.0 - 46.0 %   MCV 96.0 78.0 - 100.0 fL   MCH 31.8 26.0 - 34.0 pg   MCHC 33.1 30.0 - 36.0 g/dL   RDW 13.4 11.5 - 15.5 %   Platelets 111 (L) 150 - 400 K/uL    Comment: SPECIMEN CHECKED FOR CLOTS CONSISTENT WITH PREVIOUS RESULT   Comprehensive metabolic panel     Status: Abnormal   Collection Time: 12/26/2015  3:15 AM  Result Value Ref Range   Sodium 140 135 - 145 mmol/L   Potassium 4.6 3.5 - 5.1 mmol/L   Chloride 111 101 - 111 mmol/L   CO2 19 (L) 22 - 32 mmol/L   Glucose, Bld 131 (H) 65 - 99 mg/dL   BUN 39 (H) 6 - 20 mg/dL   Creatinine, Ser 1.53 (H) 0.44 - 1.00 mg/dL   Calcium 8.4 (L) 8.9 - 10.3 mg/dL   Total Protein 5.9 (L) 6.5 - 8.1 g/dL   Albumin 3.2 (L) 3.5 - 5.0 g/dL   AST 30 15 - 41 U/L   ALT 10 (L) 14 - 54 U/L   Alkaline Phosphatase 34 (L) 38 - 126 U/L   Total Bilirubin 0.6 0.3 - 1.2 mg/dL   GFR calc non Af Amer 28 (L) >60 mL/min   GFR calc Af Amer 33 (L) >60 mL/min    Comment: (NOTE) The eGFR has been calculated using the CKD EPI equation. This calculation has not been validated in all clinical situations. eGFR's persistently <60 mL/min signify possible Chronic Kidney Disease.    Anion gap 10 5 - 15  Protime-INR     Status: Abnormal   Collection Time: 01/07/2016  3:15 AM  Result Value Ref Range   Prothrombin Time 18.4 (H) 11.6 - 15.2 seconds   INR 1.53 (H) 0.00 - 1.49    Dg Chest Port 1 View  12/28/2015  CLINICAL DATA:  Epigastric pain EXAM: PORTABLE CHEST 1 VIEW COMPARISON:  05/10/2008 FINDINGS: Heart size upper normal. Negative for heart failure. Dual lead pacemaker unchanged with leads in the right atrium and right ventricle. Lungs are clear without infiltrate effusion or mass. No change from  the prior study IMPRESSION: No active disease. Electronically Signed   By: Franchot Gallo M.D.   On: 12/25/2015 16:36   Dg Abd Portable 1v  01/10/2016  CLINICAL DATA:  Epigastric pain EXAM: PORTABLE ABDOMEN - 1 VIEW COMPARISON:  None. FINDINGS: Dilated small bowel loops in the mid abdomen. There is gas in the  stomach which is nondilated. Gasless colon. No acute skeletal abnormality. IMPRESSION: Dilated small bowel loops suggesting small-bowel obstruction. Electronically Signed   By: Franchot Gallo M.D.   On: 12/29/2015 16:43    ROS negative except abov Blood pressure 125/57, pulse 74, temperature 98.1 F (36.7 C), temperature source Oral, resp. rate 27, height '5\' 2"'  (1.575 m), weight 45.1 kg (99 lb 6.8 oz), SpO2 98 %. Physical Exam vital signs stable febrile no acute distress em please see preassesment evaluation labs stabe  Assessment/Plan: GI bleeding in patient on Coumdin without previous GI workup Plan: the risks benefits methods of endoscopy was discussed with the patient and will proceed this morning with further  Workup and plans and recommendations pending those findngs  Argusta Mcgann E 12/19/2015, 7:53 AM

## 2015-12-21 NOTE — Progress Notes (Signed)
Patient's HR elevated to 130s multiple times and sustaining for a few seconds before returning to 80s-90s. Possible conversion to A-Fib at times. MD made aware and ordered EKG and PO Metoprolol to be given now. VS stable- patient asymptomatic. Metoprolol administered as ordered and EKG obtained. HR stable at 88. Will continue to monitor.

## 2015-12-21 NOTE — Progress Notes (Signed)
Utilization Review Completed.[name] [name] T2/03/2016  

## 2015-12-22 ENCOUNTER — Encounter (HOSPITAL_COMMUNITY): Payer: Self-pay | Admitting: Gastroenterology

## 2015-12-22 ENCOUNTER — Inpatient Hospital Stay (HOSPITAL_COMMUNITY): Payer: Medicare Other

## 2015-12-22 ENCOUNTER — Inpatient Hospital Stay (HOSPITAL_COMMUNITY): Payer: Medicare Other | Admitting: Certified Registered Nurse Anesthetist

## 2015-12-22 ENCOUNTER — Other Ambulatory Visit (HOSPITAL_COMMUNITY): Payer: Medicare Other

## 2015-12-22 DIAGNOSIS — R5383 Other fatigue: Secondary | ICD-10-CM

## 2015-12-22 DIAGNOSIS — K56609 Unspecified intestinal obstruction, unspecified as to partial versus complete obstruction: Secondary | ICD-10-CM

## 2015-12-22 DIAGNOSIS — N289 Disorder of kidney and ureter, unspecified: Secondary | ICD-10-CM

## 2015-12-22 DIAGNOSIS — E872 Acidosis, unspecified: Secondary | ICD-10-CM

## 2015-12-22 DIAGNOSIS — I10 Essential (primary) hypertension: Secondary | ICD-10-CM

## 2015-12-22 DIAGNOSIS — J9601 Acute respiratory failure with hypoxia: Secondary | ICD-10-CM

## 2015-12-22 DIAGNOSIS — K922 Gastrointestinal hemorrhage, unspecified: Secondary | ICD-10-CM

## 2015-12-22 LAB — BLOOD GAS, ARTERIAL
ACID-BASE DEFICIT: 9.8 mmol/L — AB (ref 0.0–2.0)
Acid-base deficit: 12 mmol/L — ABNORMAL HIGH (ref 0.0–2.0)
Bicarbonate: 16.6 mEq/L — ABNORMAL LOW (ref 20.0–24.0)
Bicarbonate: 15.8 mEq/L — ABNORMAL LOW (ref 20.0–24.0)
DELIVERY SYSTEMS: POSITIVE
DRAWN BY: 422461
Delivery systems: POSITIVE
Drawn by: 235321
EXPIRATORY PAP: 6
EXPIRATORY PAP: 6
FIO2: 1
FIO2: 1
INSPIRATORY PAP: 14
INSPIRATORY PAP: 14
MODE: POSITIVE
Mode: POSITIVE
O2 Saturation: 96.6 %
O2 Saturation: 84.6 %
PATIENT TEMPERATURE: 99.2
PATIENT TEMPERATURE: 98.6
PCO2 ART: 42.1 mmHg (ref 35.0–45.0)
PH ART: 7.223 — AB (ref 7.350–7.450)
PO2 ART: 106 mmHg — AB (ref 80.0–100.0)
PO2 ART: 62.7 mmHg — AB (ref 80.0–100.0)
RATE: 20 resp/min
TCO2: 16.3 mmol/L (ref 0–100)
TCO2: 16.1 mmol/L (ref 0–100)
pCO2 arterial: 48.3 mmHg — ABNORMAL HIGH (ref 35.0–45.0)
pH, Arterial: 7.142 — CL (ref 7.350–7.450)

## 2015-12-22 LAB — PROTIME-INR
INR: 1.76 — ABNORMAL HIGH (ref 0.00–1.49)
PROTHROMBIN TIME: 20.5 s — AB (ref 11.6–15.2)

## 2015-12-22 LAB — CBC WITH DIFFERENTIAL/PLATELET
Basophils Absolute: 0 10*3/uL (ref 0.0–0.1)
Basophils Relative: 0 %
EOS PCT: 0 %
Eosinophils Absolute: 0 10*3/uL (ref 0.0–0.7)
HEMATOCRIT: 29.1 % — AB (ref 36.0–46.0)
HEMOGLOBIN: 9.4 g/dL — AB (ref 12.0–15.0)
LYMPHS ABS: 0.4 10*3/uL — AB (ref 0.7–4.0)
Lymphocytes Relative: 12 %
MCH: 31.8 pg (ref 26.0–34.0)
MCHC: 32.3 g/dL (ref 30.0–36.0)
MCV: 98.3 fL (ref 78.0–100.0)
MONOS PCT: 8 %
Monocytes Absolute: 0.3 10*3/uL (ref 0.1–1.0)
NEUTROS ABS: 2.7 10*3/uL (ref 1.7–7.7)
Neutrophils Relative %: 80 %
Platelets: 116 10*3/uL — ABNORMAL LOW (ref 150–400)
RBC: 2.96 MIL/uL — ABNORMAL LOW (ref 3.87–5.11)
RDW: 13.8 % (ref 11.5–15.5)
WBC: 3.4 10*3/uL — ABNORMAL LOW (ref 4.0–10.5)

## 2015-12-22 LAB — GLUCOSE, CAPILLARY
GLUCOSE-CAPILLARY: 120 mg/dL — AB (ref 65–99)
GLUCOSE-CAPILLARY: 111 mg/dL — AB (ref 65–99)
GLUCOSE-CAPILLARY: 124 mg/dL — AB (ref 65–99)
GLUCOSE-CAPILLARY: 189 mg/dL — AB (ref 65–99)
Glucose-Capillary: 58 mg/dL — ABNORMAL LOW (ref 65–99)

## 2015-12-22 LAB — COMPREHENSIVE METABOLIC PANEL
ALK PHOS: 25 U/L — AB (ref 38–126)
ALT: 9 U/L — ABNORMAL LOW (ref 14–54)
ANION GAP: 10 (ref 5–15)
AST: 42 U/L — AB (ref 15–41)
Albumin: 2.7 g/dL — ABNORMAL LOW (ref 3.5–5.0)
BILIRUBIN TOTAL: 0.3 mg/dL (ref 0.3–1.2)
BUN: 47 mg/dL — AB (ref 6–20)
CALCIUM: 8.2 mg/dL — AB (ref 8.9–10.3)
CO2: 18 mmol/L — ABNORMAL LOW (ref 22–32)
Chloride: 117 mmol/L — ABNORMAL HIGH (ref 101–111)
Creatinine, Ser: 1.87 mg/dL — ABNORMAL HIGH (ref 0.44–1.00)
GFR calc Af Amer: 26 mL/min — ABNORMAL LOW (ref >60)
GFR, EST NON AFRICAN AMERICAN: 22 mL/min — AB (ref >60)
Glucose, Bld: 118 mg/dL — ABNORMAL HIGH (ref 65–99)
POTASSIUM: 4.3 mmol/L (ref 3.5–5.1)
Sodium: 145 mmol/L (ref 135–145)
TOTAL PROTEIN: 5 g/dL — AB (ref 6.5–8.1)

## 2015-12-22 LAB — LIPASE, BLOOD: LIPASE: 17 U/L (ref 11–51)

## 2015-12-22 LAB — LACTIC ACID, PLASMA: Lactic Acid, Venous: 3.3 mmol/L (ref 0.5–2.0)

## 2015-12-22 LAB — BRAIN NATRIURETIC PEPTIDE: B Natriuretic Peptide: 463.7 pg/mL — ABNORMAL HIGH (ref 0.0–100.0)

## 2015-12-22 LAB — PROCALCITONIN: Procalcitonin: 7.8 ng/mL

## 2015-12-22 LAB — TROPONIN I: TROPONIN I: 0.09 ng/mL — AB (ref <0.031)

## 2015-12-22 MED ORDER — CHLORHEXIDINE GLUCONATE 0.12% ORAL RINSE (MEDLINE KIT)
15.0000 mL | Freq: Two times a day (BID) | OROMUCOSAL | Status: DC
  Administered 2015-12-22 – 2015-12-26 (×9): 15 mL via OROMUCOSAL

## 2015-12-22 MED ORDER — MIDAZOLAM HCL 2 MG/2ML IJ SOLN
1.0000 mg | INTRAMUSCULAR | Status: DC | PRN
  Administered 2015-12-22: 2 mg via INTRAVENOUS
  Administered 2015-12-22: 1 mg via INTRAVENOUS
  Administered 2015-12-22 – 2015-12-23 (×2): 2 mg via INTRAVENOUS
  Filled 2015-12-22 (×4): qty 2

## 2015-12-22 MED ORDER — SODIUM CHLORIDE 0.9 % IV BOLUS (SEPSIS)
1000.0000 mL | Freq: Once | INTRAVENOUS | Status: AC
  Administered 2015-12-22: 1000 mL via INTRAVENOUS

## 2015-12-22 MED ORDER — SUCCINYLCHOLINE CHLORIDE 20 MG/ML IJ SOLN
INTRAMUSCULAR | Status: DC | PRN
  Administered 2015-12-22: 80 mg via INTRAVENOUS

## 2015-12-22 MED ORDER — ANTISEPTIC ORAL RINSE SOLUTION (CORINZ)
7.0000 mL | Freq: Four times a day (QID) | OROMUCOSAL | Status: DC
  Administered 2015-12-22 – 2015-12-26 (×18): 7 mL via OROMUCOSAL

## 2015-12-22 MED ORDER — HALOPERIDOL LACTATE 5 MG/ML IJ SOLN
2.0000 mg | Freq: Four times a day (QID) | INTRAMUSCULAR | Status: DC | PRN
  Administered 2015-12-22: 2 mg via INTRAVENOUS
  Filled 2015-12-22: qty 1

## 2015-12-22 MED ORDER — PANTOPRAZOLE SODIUM 40 MG IV SOLR
40.0000 mg | Freq: Two times a day (BID) | INTRAVENOUS | Status: DC
  Administered 2015-12-25 – 2015-12-26 (×4): 40 mg via INTRAVENOUS
  Filled 2015-12-22 (×5): qty 40

## 2015-12-22 MED ORDER — SODIUM CHLORIDE 0.9 % IV SOLN
8.0000 mg/h | INTRAVENOUS | Status: AC
  Administered 2015-12-22 – 2015-12-24 (×6): 8 mg/h via INTRAVENOUS
  Filled 2015-12-22 (×12): qty 80

## 2015-12-22 MED ORDER — PIPERACILLIN-TAZOBACTAM IN DEX 2-0.25 GM/50ML IV SOLN
2.2500 g | Freq: Four times a day (QID) | INTRAVENOUS | Status: DC
  Administered 2015-12-22 – 2015-12-25 (×14): 2.25 g via INTRAVENOUS
  Filled 2015-12-22 (×18): qty 50

## 2015-12-22 MED ORDER — DEXTROSE 50 % IV SOLN
INTRAVENOUS | Status: AC
  Administered 2015-12-22: 50 mL
  Filled 2015-12-22: qty 50

## 2015-12-22 MED ORDER — SODIUM CHLORIDE 0.9 % IV BOLUS (SEPSIS)
2000.0000 mL | Freq: Once | INTRAVENOUS | Status: AC
  Administered 2015-12-22: 2000 mL via INTRAVENOUS

## 2015-12-22 MED ORDER — IOHEXOL 300 MG/ML  SOLN: 25.0000 mL | INTRAMUSCULAR | Status: AC

## 2015-12-22 MED ORDER — CHLORHEXIDINE GLUCONATE 0.12% ORAL RINSE (MEDLINE KIT): 15.0000 mL | Freq: Two times a day (BID) | OROMUCOSAL | Status: DC

## 2015-12-22 MED ORDER — PHENYLEPHRINE HCL 10 MG/ML IJ SOLN
0.0000 ug/min | INTRAVENOUS | Status: DC
  Administered 2015-12-22: 50 ug/min via INTRAVENOUS
  Administered 2015-12-22: 20 ug/min via INTRAVENOUS
  Filled 2015-12-22 (×3): qty 1

## 2015-12-22 MED ORDER — ETOMIDATE 2 MG/ML IV SOLN
INTRAVENOUS | Status: DC | PRN
  Administered 2015-12-22: 18 mg via INTRAVENOUS

## 2015-12-22 MED ORDER — PHENYLEPHRINE HCL 10 MG/ML IJ SOLN
INTRAMUSCULAR | Status: DC | PRN
  Administered 2015-12-22 (×2): 100 ug via INTRAVENOUS

## 2015-12-22 MED ORDER — ANTISEPTIC ORAL RINSE SOLUTION (CORINZ): 7.0000 mL | Freq: Four times a day (QID) | OROMUCOSAL | Status: DC

## 2015-12-22 MED ORDER — SODIUM CHLORIDE 0.9 % IV SOLN
80.0000 mg | Freq: Once | INTRAVENOUS | Status: AC
  Administered 2015-12-22: 80 mg via INTRAVENOUS
  Filled 2015-12-22: qty 80

## 2015-12-22 MED ORDER — FENTANYL CITRATE (PF) 100 MCG/2ML IJ SOLN
25.0000 ug | INTRAMUSCULAR | Status: DC | PRN
  Administered 2015-12-22: 50 ug via INTRAVENOUS
  Filled 2015-12-22: qty 2

## 2015-12-22 MED ORDER — DEXTROSE 5 % IV SOLN
0.0000 ug/min | INTRAVENOUS | Status: DC
  Administered 2015-12-22 – 2015-12-26 (×27): 200 ug/min via INTRAVENOUS
  Filled 2015-12-22 (×33): qty 4

## 2015-12-22 NOTE — Progress Notes (Signed)
eLink Physician-Brief Progress Note Patient Name: ELLESE JULIUS DOB: 07-25-1921 MRN: [id number]   Date of Service  12/22/2015  HPI/Events of Note  Camera chk shows patient restless in the bed on NRB mask. RN reports unable to pass NGT. QTc .   eICU Interventions  1. Haldol  IV q6hr prn 2. Reattempt NGT after while to decompress stomach     Intervention Category Major Interventions: Change in mental status - evaluation and management  Lawanda Cousins 12/22/2015, 2:57 AM

## 2015-12-22 NOTE — Progress Notes (Deleted)
PULMONARY / CRITICAL CARE MEDICINE   Name: [name] [name] MRN: [id number] DOB: 07-22-1921    ADMISSION DATE:  01/09/2016 CONSULTATION DATE:  12/19/2015  REFERRING MD:  [facility] hospitalist  CHIEF COMPLAINT:  Confusion, hypotension, abd distension   VITAL SIGNS: BP 64/41 mmHg  Pulse 70  Temp(Src) 96.3 F (35.7 C) (Axillary)  Resp 22  Ht 5\' 2"  (1.575 m)  Wt 99 lb 6.8 oz (45.1 kg)  BMI 18.18 kg/m2  SpO2 100%  HEMODYNAMICS:    VENTILATOR SETTINGS: Vent Mode:  [-] PRVC FiO2 (%):  [100 %] 100 % Set Rate:  [12 bmp-22 bmp] 22 bmp Vt Set:  [400 mL] 400 mL PEEP:  [5 cmH20-8 cmH20] 5 cmH20 Plateau Pressure:  [21 cmH20-24 cmH20] 24 cmH20  INTAKE / OUTPUT: I/O last 3 completed shifts: In: 3018.3 [I.V.:2968.3; IV Piggyback:50] Out: 325 [Urine:325]  PHYSICAL EXAMINATION: General:  Frail elderly female in NAD on vent Neuro: follows simple commands, MAE HEENT:  ETT, mm pink/moist Cardiovascular: s1s2 rrr, no m/r/g Lungs:  Even/non-labored, lungs bilaterally coarse rhonchi Abdomen:  Mild distention, tender to palapation Musculoskeletal:  No cyanosis. No clubbing. No edema Skin:  Intact, cool to touch  LABS: PULMONARY  Recent Labs Lab 12/22/15 0119 12/22/15 0345  PHART 7.223* 7.142*  PCO2ART 42.1 48.3*  PO2ART 106* 62.7*  HCO3 16.6* 15.8*  TCO2 16.3 16.1  O2SAT 96.6 84.6    CBC  Recent Labs Lab Jan 09, 2016 1649 12/31/2015 0230 12/23/2015 1600 12/22/15 0124  HGB 11.6* 11.2* 10.9* 9.4*  HCT 34.4* 33.8* 33.4* 29.1*  WBC 5.3 4.6  --  3.4*  PLT 128* 111*  --  116*    COAGULATION  Recent Labs Lab 01/09/2016 1309 12/24/2015 0315 12/22/15 0124  INR 1.90* 1.53* 1.76*    CARDIAC  Recent Labs Lab January 09, 2016 1309 01-09-16 2031 01/06/2016 0230 12/22/15 0124  TROPONINI 0.07* 0.11* 0.13* 0.09*   No results for input(s): PROBNP in the last 168 hours.   CHEMISTRY  Recent Labs Lab 01/09/2016 1309 12/29/2015 0315 12/22/15 0124  NA 140 140 145  K 4.1 4.6 4.3  CL 106 111 117*   CO2 19* 19* 18*  GLUCOSE 250* 131* 118*  BUN 43* 39* 47*  CREATININE 1.60* 1.53* 1.87*  CALCIUM 9.7 8.4* 8.2*  MG 2.2  --   --    Estimated Creatinine Clearance: 13.4 mL/min (by C-G formula based on Cr of 1.87).   LIVER  Recent Labs Lab 01/09/2016 1309 01/11/2016 0315 12/22/15 0124  AST 36 30 42*  ALT 11* 10* 9*  ALKPHOS 48 34* 25*  BILITOT 0.9 0.6 0.3  PROT 7.5 5.9* 5.0*  ALBUMIN 4.1 3.2* 2.7*  INR 1.90* 1.53* 1.76*    INFECTIOUS  Recent Labs Lab 12/22/15 0124 12/22/15 0130  LATICACIDVEN 3.3*  --   PROCALCITON  --  7.80    ENDOCRINE CBG (last 3)   Recent Labs  12/25/2015 1733 12/25/2015 2144 12/22/15 0832  GLUCAP 116* 122* 120*    IMAGING x48h Dg Chest Port 1 View  12/22/2015  CLINICAL DATA:  82 year old female with abdominal pain EXAM: PORTABLE CHEST 1 VIEW COMPARISON:  Radiograph dated 09-Jan-2016 FINDINGS: There is a patchy area of opacity in the right mid and lower lung field compatible with atelectasis/ pneumonia. Small right pleural effusion may be present. The left lung is clear. No pneumothorax. There is cardiomegaly. Left pectoral pacemaker device. Degenerative changes the spine. IMPRESSION: Right lower lung field consolidation. Electronically Signed   By: Ceasar Mons.D.  On: 12/22/2015 01:56   Dg Chest Port 1 View  January 01, 2016  CLINICAL DATA:  Epigastric pain EXAM: PORTABLE CHEST 1 VIEW COMPARISON:  05/10/2008 FINDINGS: Heart size upper normal. Negative for heart failure. Dual lead pacemaker unchanged with leads in the right atrium and right ventricle. Lungs are clear without infiltrate effusion or mass. No change from the prior study IMPRESSION: No active disease. Electronically Signed   By: Marlan Palau M.D.   On: 01-01-16 16:36   Dg Abd Portable 1v  12/22/2015  CLINICAL DATA:  Acute onset of hypotension and hypoxic respiratory failure. Abdominal distention. Initial encounter. EXAM: PORTABLE ABDOMEN - 1 VIEW COMPARISON:  Abdominal radiograph  performed January 01, 2016 FINDINGS: There is mildly worsening diffuse dilatation of small-bowel loops to 4.3 cm in maximal diameter, concerning for small bowel obstruction. The stomach is also filled with air. The colon is not well seen. No free intra-abdominal air is identified, though evaluation for free air is limited on a single supine view. No acute osseous abnormalities are identified. IMPRESSION: Mildly worsening diffuse dilatation of small-bowel loops to 4.3 cm in maximal diameter, concerning for worsening small bowel obstruction. No free intra-abdominal air seen. Electronically Signed   By: Roanna Raider M.D.   On: 12/22/2015 01:53   Dg Abd Portable 1v  01-Jan-2016  CLINICAL DATA:  Epigastric pain EXAM: PORTABLE ABDOMEN - 1 VIEW COMPARISON:  None. FINDINGS: Dilated small bowel loops in the mid abdomen. There is gas in the stomach which is nondilated. Gasless colon. No acute skeletal abnormality. IMPRESSION: Dilated small bowel loops suggesting small-bowel obstruction. Electronically Signed   By: Marlan Palau M.D.   On: 01/01/2016 16:43    EVENTS / STUDIES: 2/04  Admit to [facility] with rectal bleeding, found to have SBO 2/05  EGD >> gastritis, concern for ischemia  2/06  CT ABD/Pelvis >>   LINES / TUBES: ETT 2/6 >>   ABX: Zosyn 2/5 >>   CULTURES:    DISCUSSION:  82 y/o F admitted with rectal bleeding 2/4.  KUB concerning for SBO.  EGD on 2/5 showed gastritis probably of ischemic variety.  She later became unresponsive with agonal breathing (sats in 40's), treated with 1L NS and bipap with some improvement.  Follow up imaging concerning for new RLL infiltrate and worsening SBO.  Family unable to agree on plan of care and patient intubated and placed on neo for hypotension.     ASSESSMENT / PLAN:  PULMONARY A: Suspected Aspiration - likely associated with low lung volume secondary to acute abdominal distention.  Acute hypoxemic resp failure - s/p intubation 2/5 P:   MV support, 8 cc/kg   Wean PEEP / FiO2 for sats > 90% Intermittent CXR  ABG PRN, RT having difficulty drawing post vent ABG PRN Albuterol   CARDIOVASCULAR A:  Hx CAD status post pacemaker Hypothermia Hypotension - initially resolved with fluids. Likely dehydration due to nothing by mouth and small bowel obstruction P:  NS @ 125 ml/hr Neo for MAP >65 Rule out MI, trend troponin Await ECHO Hopeful to hold off on central line / aline due to risk of complications Add bair hugger  RENAL A:   Acute kidney injury - secondary to dehydration Lactic Acidosis  P:   Trend BMP / UOP  Replace electrolytes as indicated   GASTROINTESTINAL A:   Rectal Bleeding / Melena  SBO - noted on KUB on admit Gastritis - noted on EGD, concern for ischemia P:   Protonix infusion No further interventions per GI at  this time, signed off NPO  OGT to LIS for gastric decompression  Follow up CT ABD/Pelvis   HEMATOLOGIC A:   Anemia - due to bleeding and chronic illness Coagulopathy - in setting of coumadin  P:  Trend CBC SCD's for DVT prophylaxis  Transfuse if actively bleeding or if hemoglobin less than 7 g percent Monitor INR S/p Vitamin K   INFECTIOUS A:   Possible aspiration P:   Empiric abx as above Trend fever curve / WBC  ENDOCRINE A:   At risk for hypoglycemia   P:   Monitor glucose on BMP  NEUROLOGIC A:   Acute encephalopathy - in setting of SBO, AMS, hypotension P:   RASS goal: 0  Serial neuro exams  PRN fentanyl for pain   FAMILY  - Updates:  Daughter Alona Bene updated at the bedside extensively  - Inter-disciplinary family meet or Palliative Care meeting due by:  12/22/2015 per Dr. Marchelle Gearing at the bedside with nurse present. Daughter Alona Bene has worked in the [employer]). She wanted to know the outcome of CPR in an elderly patient in the ICU. Explained that the chances of good recovery and prognostic outcomes from CPR and an ICU patient that age 58 is poor. She  wants to discuss this with her brother was on his way from [place]. Patient's husband died after being in hospice care 18 months ago and the daughter is not averse to hospice concept but at baseline this patient is lived fairly well and functional and therefore this time remains full code looking for reversible etiologies.     Canary Brim, NP-C [facility] Pgr: [phone] or if no answer [phone] 12/22/2015, 11:21 AM

## 2015-12-22 NOTE — Progress Notes (Signed)
eLink Physician-Brief Progress Note Patient Name: [name] [name] DOB: August 13, 1921 MRN: [id number]   Date of Service  12/22/2015  HPI/Events of Note  Camera chk shows patient comfortable in bed. Doesn't follow commands. Mildly increased WOB on NRB w/ Sat 98%. Paced rhythm. Still hypotensive received her 3rd bolus this evening. Awaiting Neosynephrine drip to arrive. Last maroon bowel movement was on arrival to ICU - none since.  eICU Interventions  1. Start Neo drip to maintain MAP & SBP 2. Repeat Hgb/Hct w/ 5am labs 3. TTE pending. Trending troponin I 4. Low threshold for intubation if mental status does not improve with resolution of hypotension     Intervention Category Major Interventions: Hypotension - evaluation and management  Lawanda Cousins 12/22/2015, 3:29 AM

## 2015-12-22 NOTE — Clinical Documentation Improvement (Addendum)
Internal Medicine  Can the diagnosis of "Circulatory hypotension" / "shock"be further specified, if appropriate? Thank you    Cardiogenic Shock  Hypovolemic Shock   Clinically Undetermined  Document any associated diagnoses/conditions.   Supporting Information:  98/68, 83/62, 78/57...vasopressor given, IVF 500 ml NS bolus/ e link physician note 12/22/15 [place]   Please exercise your independent, professional judgment when responding. A specific answer is not anticipated or expected.   Thank You,  Lavonda Jumbo [employer] Information [employer] [place] [phone]

## 2015-12-22 NOTE — Progress Notes (Signed)
eLink Physician-Brief Progress Note Patient Name: BETANIA DIZON DOB: 1920/12/12 MRN: [id number]   Date of Service  12/22/2015  HPI/Events of Note  Camera Check on patient just transferred to ICU w/ Hypotension & Acute Hypoxic Respiratory Failure. Still with hypotension post 1L NS bolus. Now on BiPAP. Altered mentation persists & not following commands. Multiple family members and full code. Admitted w/ GIB & persistent abdominal distension.   eICU Interventions  1. ABG pending 2. X-ray Pending 3. Dr. Marchelle Gearing to bedside for evaluation     Intervention Category Evaluation Type: New Patient Evaluation  Lawanda Cousins 12/22/2015, 1:37 AM

## 2015-12-22 NOTE — Progress Notes (Signed)
Pt's daughter called this RN into room stating pt began fighting in the bed and then would not answer her. This RN went into room and found pt staring straight ahead and not responding to RN. Pt had pulse and was breathing agonal breaths. VS immediately obtained and RRT RN and Respiratory Therapist called. Orders carried out per Rapid Response protocol and pt transferred to ICU on bipap per provider on call's orders.

## 2015-12-22 NOTE — Progress Notes (Signed)
Initial Nutrition Assessment  DOCUMENTATION CODES:   Underweight  INTERVENTION:   If patient is expected to remain intubated for >48 hours, recommend nutrition support.  TF recommendations: Initiate Vital AF 1.2 @ 20 ml/hr and increase by 10 ml every 4 hours to goal rate of 40 ml/hr.   Tube feeding regimen provides 1152 kcal (98% of needs), 72 grams of protein, and 779 ml of H2O.   RD to continue to monitor  NUTRITION DIAGNOSIS:   Inadequate oral intake related to inability to eat as evidenced by NPO status.  GOAL:   Patient will meet greater than or equal to 90% of their needs  MONITOR:   Vent status, Labs, Weight trends, I & O's , GOC  REASON FOR ASSESSMENT:   Ventilator    ASSESSMENT:   82 year old female with a history of symptomatic bradycardia status post pacemaker insertion, paroxysmal atrial fibrillation on anticoagulation for the last 5 years, hypertension, who presents to the ER today due to rectal bleeding, hemoglobin remained stable during hospital stay, did not require any transfusion, status post EGD by Dr. Ewing Schlein 01/05/2016, significant for severe gastritis with ulceration, possibly ischemic.  Patient in room with no family present. Unable to gather history at this time. Per weight history, pt has lost 8 lb since May 2016, insignificant for time frame. Pt with NGT for suction. GOC to be decided by family.  Patient is currently intubated on ventilator support d/t AMS and progressive respiratory failure. MV: 9.4 L/min Temp (24hrs), Avg:96.8 F (36 C), Min:95.2 F (35.1 C), Max:100 F (37.8 C)  Nutrition-Focused physical exam completed. Findings are no fat depletion, moderate-severe muscle depletion, and no edema.   Labs reviewed.  Diet Order:  Diet NPO time specified  Skin:  Reviewed, no issues  Last BM:  2/5  Height:   Ht Readings from Last 1 Encounters:  12/22/15  (1.575 m)    Weight:   Wt Readings from Last 1 Encounters:  01/04/2016 99  lb 6.8 oz (45.1 kg)    Ideal Body Weight:  50 kg  BMI:  Body mass index is 18.18 kg/(m^2).  Estimated Nutritional Needs:   Kcal:  1175  Protein:  65-75g  Fluid:  >1.2L/day  EDUCATION NEEDS:    None at this time.  Tilda Franco, MS, RD, LDN Pager: [phone] After Hours Pager: [phone]

## 2015-12-22 NOTE — Progress Notes (Signed)
Phenylephrine switched to quad strength. Confirmed plan with Kary Kos.  Charolotte Eke, PharmD, pager [phone]. 12/22/2015,10:53 AM.

## 2015-12-22 NOTE — Significant Event (Signed)
Rapid Response Event Note  Overview: Time Called: 0016 Arrival Time: 0020 Event Type: Neurologic, Hypotension  Initial Focused Assessment:Pt obtunded on arrival with agonal respirations. Bag valve mask assisted ventilation with  sats up to 94%-100%. Pulse is present and BP is stable. She woke agitated and suddenly was unresponsive. Abdomen is distended and firm. Pt. Had a bloody stool. Gradually started to wake after assisted ventilation and would nod.    Interventions:NS bolus. Assisted ventilation., bipap, transferred to icu   Event Summary: Name of Physician Notified: Wyn Forster NP at 0000  Name of Consulting Physician Notified: Dr Bonney Aid  at 0130  Outcome: Transferred (Comment) (1238 as icu)  Event End Time: 0110  Bayard Hugger

## 2015-12-22 NOTE — Progress Notes (Signed)
eLink Physician-Brief Progress Note Patient Name: [name] [name] DOB: [birth year] MRN: [id number]   Date of Service  12/22/2015  HPI/Events of Note  RN contacted regarding acute hypotension. Patient paced on telemetry. Repeat BP same.   eICU Interventions  Starting Neosynephrine drip PIV to maintain MAP     Intervention Category Major Interventions: Shock - evaluation and management  Lawanda Cousins 12/22/2015, 3:18 AM

## 2015-12-22 NOTE — Progress Notes (Signed)
Echocardiogram 2D Echocardiogram has been performed.  Dorothey Baseman 12/22/2015, 4:03 PM

## 2015-12-22 NOTE — Progress Notes (Signed)
ANTIBIOTIC CONSULT NOTE - INITIAL  Pharmacy Consult for Zosyn Indication: pneumonia  No Known Allergies  Patient Measurements: Height:  (157.5 cm) Weight: 99 lb 6.8 oz (45.1 kg) IBW/kg (Calculated) : 50.1   Vital Signs: Temp: 99.2 F (37.3 C) (02/05 2135) Temp Source: Oral (02/05 2135) BP: 109/67 mmHg (02/06 0033) Pulse Rate: 83 (02/06 0033) Intake/Output from previous day: 02/05 0701 - 02/06 0700 In: 875.4 [I.V.:875.4] Out: 325 [Urine:325] Intake/Output from this shift: Total I/O In: 0  Out: 75 [Urine:75]  Labs:  Recent Labs  01/05/2016 1309 01/05/2016 1649 01-17-16 0230 2016/01/17 0315 01-17-2016 1600  WBC 6.0 5.3 4.6  --   --   HGB 11.2* 11.6* 11.2*  --  10.9*  PLT 151 128* 111*  --   --   CREATININE 1.60*  --   --  1.53*  --    Estimated Creatinine Clearance: 16.4 mL/min (by C-G formula based on Cr of 1.53). No results for input(s): VANCOTROUGH, VANCOPEAK, VANCORANDOM, GENTTROUGH, GENTPEAK, GENTRANDOM, TOBRATROUGH, TOBRAPEAK, TOBRARND, AMIKACINPEAK, AMIKACINTROU, AMIKACIN in the last 72 hours.   Microbiology: Recent Results (from the past 720 hour(s))  MRSA PCR Screening     Status: None   Collection Time: 12/23/2015  3:58 PM  Result Value Ref Range Status   MRSA by PCR NEGATIVE NEGATIVE Final    Comment:        The GeneXpert MRSA Assay (FDA approved for NASAL specimens only), is one component of a comprehensive MRSA colonization surveillance program. It is not intended to diagnose MRSA infection nor to guide or monitor treatment for MRSA infections.     Medical History: Past Medical History  Diagnosis Date  . Cardiac dysrhythmia, unspecified   . HTN (hypertension)   . Cardiac pacemaker in situ   . Syncope   . Bradycardia     Medications:  Scheduled:  . dorzolamide  1 drop Both Eyes BID  . insulin aspart  0-9 Units Subcutaneous TID WC  . latanoprost  1 drop Both Eyes QHS  . pantoprazole (PROTONIX) IV  80 mg Intravenous Once  . [START ON  12/25/2015] pantoprazole (PROTONIX) IV  40 mg Intravenous Q12H  . piperacillin-tazobactam (ZOSYN)  IV  2.25 g Intravenous Q6H  . sodium chloride  1,000 mL Intravenous Once  . sodium chloride flush  3 mL Intravenous Q12H   Infusions:  . sodium chloride    . sodium chloride 75 mL/hr at January 17, 2016 1300  . pantoprozole (PROTONIX) infusion     Assessment: 41 yoF admitted with GI bleed on 2/4 now unresponsive with acute respiratory failure.  Zosyn per Rx for PNA.  Goal of Therapy:  Treat PNA  Plan:   Zosyn 2.25 Gm IV q6h  F/u SCr/cultures  Susanne Greenhouse R 12/22/2015,1:59 AM

## 2015-12-22 NOTE — Progress Notes (Signed)
Patient has been unstable throughout the day; requiring Neo at maximum dose. Her blood pressure remained low. She had an order for a CT of the abdomen today however she was too unstable to travel to radiology. Spoke with Dr. Craige Cotta this afternoon and he agreed that waiting until tomorrow and re-evaluating was appropriate for this patient.

## 2015-12-22 NOTE — Progress Notes (Signed)
Called to patient room for O2 saturations reported in the 40's. Upon arrival, patient on oxygen via NRB mask with agonal respirations, no air movement and unresponsive. Manually ventilated and O2 saturations climbed quickly to 100 percent. Patient manually ventilated for approximately 15 minutes, when she began to awaken, opening eyes and attempting to talk. Placed on BiPAP with a mandatory rate and transported to ICU.

## 2015-12-22 NOTE — Anesthesia Procedure Notes (Signed)
Procedure Name: Intubation Performed by: Kizzie Fantasia Pre-anesthesia Checklist: Patient identified, Emergency Drugs available, Suction available, Patient being monitored and Timeout performed Patient Re-evaluated:Patient Re-evaluated prior to inductionOxygen Delivery Method: Ambu bag Preoxygenation: Pre-oxygenation with 100% oxygen Intubation Type: IV induction Ventilation: Mask ventilation without difficulty Laryngoscope Size: Mac and 3 Grade View: Grade I Tube type: Oral Tube size: 7.0 mm Number of attempts: 1 Airway Equipment and Method: Stylet Placement Confirmation: ETT inserted through vocal cords under direct vision,  positive ETCO2,  CO2 detector and breath sounds checked- equal and bilateral Secured at: 21 cm Tube secured with: Tape Dental Injury: Teeth and Oropharynx as per pre-operative assessment

## 2015-12-22 NOTE — Consult Note (Signed)
PULMONARY / CRITICAL CARE MEDICINE   Name: [name] [name] MRN: [id number] DOB: 29-Nov-1920    ADMISSION DATE:  12/17/2015 CONSULTATION DATE:  Dec 27, 2015  REFERRING MD:  [facility] hospital[facility]  CHIEF COMPLAINT:  Confusion, hypotension, abd distension   VITAL SIGNS: BP 163/145 mmHg  Pulse 65  Temp(Src) 95.2 F (35.1 C) (Axillary)  Resp 22  Ht 5\' 2"  (1.575 m)  Wt 45.1 kg (99 lb 6.8 oz)  BMI 18.18 kg/m2  SpO2 100%  HEMODYNAMICS:    VENTILATOR SETTINGS: Vent Mode:  [-] PRVC FiO2 (%):  [100 %] 100 % Set Rate:  [12 bmp-22 bmp] 22 bmp Vt Set:  [400 mL] 400 mL PEEP:  [5 cmH20-8 cmH20] 5 cmH20 Plateau Pressure:  [21 cmH20] 21 cmH20  INTAKE / OUTPUT: I/O last 3 completed shifts: In: 3018.3 [I.V.:2968.3; IV Piggyback:50] Out: 325 [Urine:325]  PHYSICAL EXAMINATION: General:  Frail elderly female in NAD on vent Neuro: follows simple commands, MAE HEENT:  ETT, mm pink/moist Cardiovascular: s1s2 rrr, no m/r/g Lungs:  Even/non-labored, lungs bilaterally coarse rhonchi Abdomen:  Mild distention, tender to palapation Musculoskeletal:  No cyanosis. No clubbing. No edema Skin:  Intact, cool to touch  LABS: PULMONARY  Recent Labs Lab 12/22/15 0119 12/22/15 0345  PHART 7.223* 7.142*  PCO2ART 42.1 48.3*  PO2ART 106* 62.7*  HCO3 16.6* 15.8*  TCO2 16.3 16.1  O2SAT 96.6 84.6    CBC  Recent Labs Lab 12/26/2015 1649 December 27, 2015 0230 12/21/2015 1600 12/22/15 0124  HGB 11.6* 11.2* 10.9* 9.4*  HCT 34.4* 33.8* 33.4* 29.1*  WBC 5.3 4.6  --  3.4*  PLT 128* 111*  --  116*    COAGULATION  Recent Labs Lab 01/06/2016 1309 12/21/2015 0315 12/22/15 0124  INR 1.90* 1.53* 1.76*    CARDIAC  Recent Labs Lab 12/19/2015 1309 01/04/2016 2031 Dec 27, 2015 0230 12/22/15 0124  TROPONINI 0.07* 0.11* 0.13* 0.09*   No results for input(s): PROBNP in the last 168 hours.   CHEMISTRY  Recent Labs Lab 12/31/2015 1309 12/28/2015 0315 12/22/15 0124  NA 140 140 145  K 4.1 4.6 4.3  CL 106 111 117*  CO2  19* 19* 18*  GLUCOSE 250* 131* 118*  BUN 43* 39* 47*  CREATININE 1.60* 1.53* 1.87*  CALCIUM 9.7 8.4* 8.2*  MG 2.2  --   --    Estimated Creatinine Clearance: 13.4 mL/min (by C-G formula based on Cr of 1.87).   LIVER  Recent Labs Lab 01/02/2016 1309 01/12/2016 0315 12/22/15 0124  AST 36 30 42*  ALT 11* 10* 9*  ALKPHOS 48 34* 25*  BILITOT 0.9 0.6 0.3  PROT 7.5 5.9* 5.0*  ALBUMIN 4.1 3.2* 2.7*  INR 1.90* 1.53* 1.76*    INFECTIOUS  Recent Labs Lab 12/22/15 0124 12/22/15 0130  LATICACIDVEN 3.3*  --   PROCALCITON  --  7.80    ENDOCRINE CBG (last 3)   Recent Labs  01/05/2016 1319 12/19/2015 1733 Dec 27, 2015 2144  GLUCAP 102* 116* 122*    IMAGING x48h Dg Chest Port 1 View  12/22/2015  CLINICAL DATA:  82 year old female with abdominal pain EXAM: PORTABLE CHEST 1 VIEW COMPARISON:  Radiograph dated 01/07/2016 FINDINGS: There is a patchy area of opacity in the right mid and lower lung field compatible with atelectasis/ pneumonia. Small right pleural effusion may be present. The left lung is clear. No pneumothorax. There is cardiomegaly. Left pectoral pacemaker device. Degenerative changes the spine. IMPRESSION: Right lower lung field consolidation. Electronically Signed   By: Elgie Collard M.D.   On:  12/22/2015 01:56   Dg Chest Port 1 View  2016/01/13  CLINICAL DATA:  Epigastric pain EXAM: PORTABLE CHEST 1 VIEW COMPARISON:  05/10/2008 FINDINGS: Heart size upper normal. Negative for heart failure. Dual lead pacemaker unchanged with leads in the right atrium and right ventricle. Lungs are clear without infiltrate effusion or mass. No change from the prior study IMPRESSION: No active disease. Electronically Signed   By: Marlan Palau M.D.   On: 2016/01/13 16:36   Dg Abd Portable 1v  12/22/2015  CLINICAL DATA:  Acute onset of hypotension and hypoxic respiratory failure. Abdominal distention. Initial encounter. EXAM: PORTABLE ABDOMEN - 1 VIEW COMPARISON:  Abdominal radiograph performed  2016/01/13 FINDINGS: There is mildly worsening diffuse dilatation of small-bowel loops to 4.3 cm in maximal diameter, concerning for small bowel obstruction. The stomach is also filled with air. The colon is not well seen. No free intra-abdominal air is identified, though evaluation for free air is limited on a single supine view. No acute osseous abnormalities are identified. IMPRESSION: Mildly worsening diffuse dilatation of small-bowel loops to 4.3 cm in maximal diameter, concerning for worsening small bowel obstruction. No free intra-abdominal air seen. Electronically Signed   By: Roanna Raider M.D.   On: 12/22/2015 01:53   Dg Abd Portable 1v  Jan 13, 2016  CLINICAL DATA:  Epigastric pain EXAM: PORTABLE ABDOMEN - 1 VIEW COMPARISON:  None. FINDINGS: Dilated small bowel loops in the mid abdomen. There is gas in the stomach which is nondilated. Gasless colon. No acute skeletal abnormality. IMPRESSION: Dilated small bowel loops suggesting small-bowel obstruction. Electronically Signed   By: Marlan Palau M.D.   On: 01/13/16 16:43    EVENTS / STUDIES: 2/04  Admit to [facility] with rectal bleeding, found to have SBO 2/05  EGD >> gastritis, concern for ischemia  2/06  CT ABD/Pelvis >>   LINES / TUBES: ETT 2/6 >>   ABX: Zosyn 2/5 >>   CULTURES:    DISCUSSION:  82 y/o F admitted with rectal bleeding 2/4.  KUB concerning for SBO.  EGD on 2/5 showed gastritis probably of ischemic variety.  She later became unresponsive with agonal breathing (sats in 40's), treated with 1L NS and bipap with some improvement.  Follow up imaging concerning for new RLL infiltrate and worsening SBO.  Family unable to agree on plan of care and patient intubated and placed on neo for hypotension.     ASSESSMENT / PLAN:  PULMONARY A: Suspected Aspiration - likely associated with low lung volume secondary to acute abdominal distention.  Acute hypoxemic resp failure - s/p intubation 2/5 P:   MV support, 8 cc/kg  Wean PEEP  / FiO2 for sats > 90% Intermittent CXR  ABG PRN, RT having difficulty drawing post vent ABG PRN Albuterol   CARDIOVASCULAR A:  Hx CAD status post pacemaker Hypothermia Hypotension - initially resolved with fluids. Likely dehydration due to nothing by mouth and small bowel obstruction P:  NS @ 125 ml/hr Neo for MAP >65 Rule out MI, trend troponin Await ECHO Hopeful to hold off on central line / aline due to risk of complications Add bair hugger  RENAL A:   Acute kidney injury - secondary to dehydration Lactic Acidosis  P:   Trend BMP / UOP  Replace electrolytes as indicated   GASTROINTESTINAL A:   Rectal Bleeding / Melena  SBO - noted on KUB on admit Gastritis - noted on EGD, concern for ischemia P:   Protonix infusion No further interventions per GI at this  time, signed off NPO  OGT to LIS for gastric decompression  Follow up CT ABD/Pelvis   HEMATOLOGIC A:   Anemia - due to bleeding and chronic illness Coagulopathy - in setting of coumadin  P:  Trend CBC SCD's for DVT prophylaxis  Transfuse if actively bleeding or if hemoglobin less than 7 g percent Monitor INR S/p Vitamin K   INFECTIOUS A:   Possible aspiration P:   Empiric abx as above Trend fever curve / WBC  ENDOCRINE A:   At risk for hypoglycemia   P:   Monitor glucose on BMP  NEUROLOGIC A:   Acute encephalopathy - in setting of SBO, AMS, hypotension P:   RASS goal: 0  Serial neuro exams  PRN fentanyl for pain   FAMILY  - Updates:  Daughter Alona Bene updated at the bedside extensively  - Inter-disciplinary family meet or Palliative Care meeting due by:  12/22/2015 per Dr. Marchelle Gearing at the bedside with nurse present. Daughter Alona Bene has worked in the [employer]). She wanted to know the outcome of CPR in an elderly patient in the ICU. Explained that the chances of good recovery and prognostic outcomes from CPR and an ICU patient that age 22 is poor. She wants to  discuss this with her brother was on his way from [place]. Patient's husband died after being in hospice care 18 months ago and the daughter is not averse to hospice concept but at baseline this patient is lived fairly well and functional and therefore this time remains full code looking for reversible etiologies.     Canary Brim, NP-C [facility] Pgr: [phone] or if no answer [phone] 12/22/2015, 9:07 AM

## 2015-12-22 NOTE — Transfer of Care (Signed)
Immediate Anesthesia Transfer of Care Note  Patient: [name] [name]  Procedure(s) Performed: * No procedures listed *  Patient Location: ICU  Anesthesia Type:General  Level of Consciousness: sedated  Airway & Oxygen Therapy: Patient remains intubated per anesthesia plan  Post-op Assessment: Report given to RN and Post -op Vital signs reviewed and stable  Post vital signs: Reviewed and stable  Last Vitals:  Filed Vitals:   12/22/15 0355 12/22/15 0526  BP:  161/84  Pulse: 64 76  Temp: 35.6 C   Resp: 21 22    Complications: No apparent anesthesia complications

## 2015-12-22 NOTE — Progress Notes (Signed)
Subjective: Intubated early this morning:  Altered mental status and progressive respiratory failure. No reported further bleeding.  Objective: Vital signs in last 24 hours: Temp:  [95.2 F (35.1 C)-99.2 F (37.3 C)] 95.2 F (35.1 C) (02/06 0700) Pulse Rate:  [59-125] 65 (02/06 0710) Resp:  [20-28] 22 (02/06 0710) BP: (78-163)/(35-145) 163/145 mmHg (02/06 0700) SpO2:  [83 %-100 %] 100 % (02/06 0710) FiO2 (%):  [100 %] 100 % (02/06 0710) Weight change:  Last BM Date: 12/17/2015  PE: GEN:  Frail, cachectic-appearing, intubated, sedated  Lab Results: CBC    Component Value Date/Time   WBC 3.4* 12/22/2015 0124   RBC 2.96* 12/22/2015 0124   HGB 9.4* 12/22/2015 0124   HCT 29.1* 12/22/2015 0124   PLT 116* 12/22/2015 0124   MCV 98.3 12/22/2015 0124   MCH 31.8 12/22/2015 0124   MCHC 32.3 12/22/2015 0124   RDW 13.8 12/22/2015 0124   LYMPHSABS 0.4* 12/22/2015 0124   MONOABS 0.3 12/22/2015 0124   EOSABS 0.0 12/22/2015 0124   BASOSABS 0.0 12/22/2015 0124   CMP     Component Value Date/Time   NA 145 12/22/2015 0124   K 4.3 12/22/2015 0124   CL 117* 12/22/2015 0124   CO2 18* 12/22/2015 0124   GLUCOSE 118* 12/22/2015 0124   BUN 47* 12/22/2015 0124   CREATININE 1.87* 12/22/2015 0124   CALCIUM 8.2* 12/22/2015 0124   PROT 5.0* 12/22/2015 0124   ALBUMIN 2.7* 12/22/2015 0124   AST 42* 12/22/2015 0124   ALT 9* 12/22/2015 0124   ALKPHOS 25* 12/22/2015 0124   BILITOT 0.3 12/22/2015 0124   GFRNONAA 22* 12/22/2015 0124   GFRAA 26* 12/22/2015 0124   Assessment:  1.  Abdominal pain and blood in stool.  Endoscopy showed severe gastritis, biopsied, possibly ischemic in origin. 2.  Altered mental status and respiratory failure, for which patient was intubated a few hours ago. 3.  Anemia, acute blood loss.  No further blood in stool reported overnight.  No blood in nasogastric tube.  Plan:  1.  Supportive GI care:  IV fluids, PPI, serial CBCs. 2.  Critical care team to see soon, and  discuss goals of care. 3.  Doubt need or utility of further GI tract testing or intervention at the present time, based on patient's current clinical state. 4.  [facility] will sign-off; please call with questions.   [name] [name] 12/22/2015, 9:11 AM   Pager [phone] If no answer or after 5 PM call [phone]

## 2015-12-22 NOTE — Progress Notes (Signed)
S: Notified by nursing staff that pt became unresponsive. O2 sats dropped into mid 50s with agonal respirations and BP was in the 60s systolic, HR in 70s. According to the patient's daughter [name] [name] who was at the bedside, the patient had been restless for the previous 30 minutes or so, she went to get the nurse and when she returned the patient was unresponsive. Rapid response was called. Pt had a weak pulse. Ambu bag was applied and sats came up to 100%, a fluid bolus was also initiated. CRNA was called to intubate pt but she became increasingly responsive so Bipap was applied. Pt transferred to ICU. Upon arrival to ICU moderate amount of dark red blood noted coming from rectum.  O: Vitals as above. Upon initial evaluation pt unresponsive, lungs with course breath sounds bilaterally, heart regular with 2/6 systolic ejection murmur. Abdomen soft but very distended. No bowel sounds audible. No LE edema. Unable to follow commands.   A/P: 1.) Acute respiratory failure, hypoxemic: Unclear etiology. I fear she may be developing acidosis from underlying etiology such as possible bowel perforation. Will obtain blood gas, lactic acid, cbc, cmet, troponin, bnp. Continue to support with Bipap for now as long as she is protecting her airway. CXR. PCCM to consult.  2.) Abd distension: Pt has dilated loops of bowel on most recent abd film. Will repeat to rule out free air and she will need abd ct when stable. NPO.  3.) GI bleeding: Had EGD today which showed gastric ulcer, possibly ischemic. Will restart IV ppi infusion and bolus her again. CBC, transfuse if necessary. IVF. 4.) GOC: Pt does not have POA, has several children. Daughter [name] [name] unsure of what her mother's wishes are regarding intubation/code status. She tried to speak with her brother but wasn't able to come to a firm decision so for now pt will remain full code.   Pt evaluated at bedside by Dr. Maryfrances Bunnell and above plan discussed.

## 2015-12-22 NOTE — Progress Notes (Signed)
eLink Physician-Brief Progress Note Patient Name: [name] [name] DOB: [birth year] MRN: [id number]   Date of Service  12/22/2015  HPI/Events of Note  Camera Chk on patient after acute change in mental status. No evidence of seizure activity & able to say her first name immediately following. Daughter, Nurse, & RT at bedside. Mental status briefly improved and now is declining again. Spoke with Daughter at bedside & she with her brother via phone. Patient remains full code. Appears unable to protect her airway & with worsening ventilation & oxygenation despite BiPAP.  eICU Interventions  Contacting Anesthesia/CRNA at [facility] for Endotracheal Intubation ASAP.     Intervention Category Major Interventions: Respiratory failure - evaluation and management;Change in mental status - evaluation and management  Lawanda Cousins 12/22/2015, 4:42 AM

## 2015-12-22 NOTE — Progress Notes (Signed)
RT x 3 unable to obtain post-intubation ABG. RN aware. MD to be notified of inability to obtain sample.

## 2015-12-22 NOTE — Progress Notes (Signed)
Arrived to patient room to obtain schedule arterial blood draw. Found patient on NRB mask, somewhat aggitated from nursing failed attempts to place NG tube for decompression. ABG rescheduled for 1 hour per Lance Coon, PA-C.

## 2015-12-22 NOTE — Progress Notes (Signed)
Hypoglycemic Event  CBG: 58  Treatment: D50 IV 50 mL  Symptoms: None  Follow-up CBG: Time: 2301 CBG Result: 189  Possible Reasons for Event: Inadequate meal intake; on ventilator and tube feed has not been started  Comments/MD notified: Dr Craige Cotta notified; CBG changed to every 4 hours    Leitha Schuller

## 2015-12-22 NOTE — Consult Note (Signed)
PULMONARY / CRITICAL CARE MEDICINE   Name: [name] [name] MRN: [id number] DOB: [birth year]    ADMISSION DATE:  01/03/2016 CONSULTATION DATE:  12/26/2015  REFERRING MD:  [facility] hospital[facility]  CHIEF COMPLAINT:  Confusion, hypotension, abd distension  HISTORY OF PRESENT ILLNESS:   82 year old African-American female who according to the daughter Alona Bene at baseline is fully functional and able to self-care and does not exhibit any evidence of dementia. She is status post pacemaker insertion 5 years ago for symptomatic bradycardia. She presented on 12/29/2015 with rectal bleeding. Initial KUB showed abdominal small bowel obstruction findings. She was also prerenal. She was neither tachycardic nor hypotensive at the time of admission. She was subsequently seen by gastroenterology. Her Coumadin was held. On 12/31/2015 she underwent endoscopy which showed gastritis probably of ischemic variety. She is continued to be nothing by mouth. In the early hours of 01/13/2016 daughter noticed that patient was getting agitated and then subsequently became unresponsive with agonal breathing, desaturation to the 40s and also hypotensive. However with fluids and BiPAP she quickly responded and by the time of critical care medicine evaluation she had a normal blood pressure after 1 L fluid and had a normal mental status although she was still slightly tachypneic but doing well on BiPAP. Critical care medicine has been consulted. Follow-up chest x-ray shows lower lung volumes then at admission associated with new right lower lobe infiltrate and worsening small bowel obstruction. Lacate 3 and wbc down to 3K  PAST MEDICAL HISTORY :  She  has a past medical history of Cardiac dysrhythmia, unspecified; HTN (hypertension); Cardiac pacemaker in situ; Syncope; and Bradycardia.  PAST SURGICAL HISTORY: She  has past surgical history that includes implantation of dual chamber pacemaker (05/09/08) and Permanent pacemaker generator change (N/A,  12/20/2014).  No Known Allergies  No current facility-administered medications on file prior to encounter.   Current Outpatient Prescriptions on File Prior to Encounter  Medication Sig  . bimatoprost (LUMIGAN) 0.03 % ophthalmic solution Place 1 drop into both eyes daily.    . dorzolamide (TRUSOPT) 2 % ophthalmic solution Place 1 drop into both eyes 2 (two) times daily.    . Multiple Vitamins-Minerals (MULTIVITAL) tablet Take 1 tablet by mouth daily.    Marland Kitchen triamterene-hydrochlorothiazide (MAXZIDE-25) 37.5-25 MG per tablet TAKE ONE (1) TABLET EACH DAY  . valsartan-hydrochlorothiazide (DIOVAN-HCT) 320-12.5 MG per tablet Take 1 tablet by mouth daily.  Marland Kitchen warfarin (COUMADIN) 3 MG tablet TAKE AS DIRECTED BY COUMADIN CLINIC  . triamterene-hydrochlorothiazide (MAXZIDE-25) 37.5-25 MG tablet TAKE ONE (1) TABLET EACH DAY (Patient not taking: Reported on 12/28/2015)    FAMILY HISTORY:  Her indicated that her mother is deceased. She indicated that her father is deceased.   SOCIAL HISTORY: She  reports that she has never smoked. She does not have any smokeless tobacco history on file. She reports that she does not drink alcohol or use illicit drugs.  REVIEW OF SYSTEMS:   According to history of present illness    VITAL SIGNS: BP 109/67 mmHg  Pulse 83  Temp(Src) 99.2 F (37.3 C) (Oral)  Resp 26  Ht  (1.575 m)  Wt 45.1 kg (99 lb 6.8 oz)  BMI 18.18 kg/m2  SpO2 100%  HEMODYNAMICS:    VENTILATOR SETTINGS: Vent Mode:  [-] Spontaneous;BIPAP;PCV FiO2 (%):  [100 %] 100 % Set Rate:  [12 bmp-20 bmp] 12 bmp PEEP:  [6 cmH20-8 cmH20] 6 cmH20  INTAKE / OUTPUT: I/O last 3 completed shifts: In: 1731.8 [I.V.:1731.8] Out: 350 [Urine:350]  PHYSICAL EXAMINATION: General:  Frail female. In bed.EMACIATED Neuro:  Alert and Oriented . FOllows all commands HEENT:  On BiPAP Cardiovascular:  Tachycardic Lungs:  Tachycpneic + but not paradoxical Abdomen:  Distended, Mildly tendeder Musculoskeletal:   No cyanosis. No clubbing. No edmea Skin:  Appears intact   LABS: PULMONARY  Recent Labs Lab 12/22/15 0119  PHART 7.223*  PCO2ART 42.1  PO2ART 106*  HCO3 16.6*  TCO2 16.3  O2SAT 96.6    CBC  Recent Labs Lab 12/17/2015 1649 12/26/2015 0230 27-Dec-2015 1600 12/22/15 0124  HGB 11.6* 11.2* 10.9* 9.4*  HCT 34.4* 33.8* 33.4* 29.1*  WBC 5.3 4.6  --  3.4*  PLT 128* 111*  --  116*    COAGULATION  Recent Labs Lab 01/13/2016 1309 12/24/2015 0315 12/22/15 0124  INR 1.90* 1.53* 1.76*    CARDIAC   Recent Labs Lab 01/09/2016 1309 12/26/2015 2031 01/12/2016 0230 12/22/15 0124  TROPONINI 0.07* 0.11* 0.13* 0.09*   No results for input(s): PROBNP in the last 168 hours.   CHEMISTRY  Recent Labs Lab 12/17/2015 1309 01/13/2016 0315 12/22/15 0124  NA 140 140 145  K 4.1 4.6 4.3  CL 106 111 117*  CO2 19* 19* 18*  GLUCOSE 250* 131* 118*  BUN 43* 39* 47*  CREATININE 1.60* 1.53* 1.87*  CALCIUM 9.7 8.4* 8.2*  MG 2.2  --   --    Estimated Creatinine Clearance: 13.4 mL/min (by C-G formula based on Cr of 1.87).   LIVER  Recent Labs Lab 12/29/2015 1309 01/13/2016 0315 12/22/15 0124  AST 36 30 42*  ALT 11* 10* 9*  ALKPHOS 48 34* 25*  BILITOT 0.9 0.6 0.3  PROT 7.5 5.9* 5.0*  ALBUMIN 4.1 3.2* 2.7*  INR 1.90* 1.53* 1.76*     INFECTIOUS  Recent Labs Lab 12/22/15 0124  LATICACIDVEN 3.3*     ENDOCRINE CBG (last 3)   Recent Labs  12/17/2015 1319 01/11/2016 1733 December 27, 2015 2144  GLUCAP 102* 116* 122*         IMAGING x48h  - image(s) personally visualized  -   highlighted in bold Dg Chest Port 1 View  12/22/2015  CLINICAL DATA:  82 year old female with abdominal pain EXAM: PORTABLE CHEST 1 VIEW COMPARISON:  Radiograph dated 01/06/2016 FINDINGS: There is a patchy area of opacity in the right mid and lower lung field compatible with atelectasis/ pneumonia. Small right pleural effusion may be present. The left lung is clear. No pneumothorax. There is cardiomegaly. Left pectoral  pacemaker device. Degenerative changes the spine. IMPRESSION: Right lower lung field consolidation. Electronically Signed   By: Elgie Collard M.D.   On: 12/22/2015 01:56   Dg Chest Port 1 View  12/29/2015  CLINICAL DATA:  Epigastric pain EXAM: PORTABLE CHEST 1 VIEW COMPARISON:  05/10/2008 FINDINGS: Heart size upper normal. Negative for heart failure. Dual lead pacemaker unchanged with leads in the right atrium and right ventricle. Lungs are clear without infiltrate effusion or mass. No change from the prior study IMPRESSION: No active disease. Electronically Signed   By: Marlan Palau M.D.   On: 12/26/2015 16:36   Dg Abd Portable 1v  12/22/2015  CLINICAL DATA:  Acute onset of hypotension and hypoxic respiratory failure. Abdominal distention. Initial encounter. EXAM: PORTABLE ABDOMEN - 1 VIEW COMPARISON:  Abdominal radiograph performed 01/07/2016 FINDINGS: There is mildly worsening diffuse dilatation of small-bowel loops to 4.3 cm in maximal diameter, concerning for small bowel obstruction. The stomach is also filled with air. The colon is not well seen. No  free intra-abdominal air is identified, though evaluation for free air is limited on a single supine view. No acute osseous abnormalities are identified. IMPRESSION: Mildly worsening diffuse dilatation of small-bowel loops to 4.3 cm in maximal diameter, concerning for worsening small bowel obstruction. No free intra-abdominal air seen. Electronically Signed   By: Roanna Raider M.D.   On: 12/22/2015 01:53   Dg Abd Portable 1v  01/12/2016  CLINICAL DATA:  Epigastric pain EXAM: PORTABLE ABDOMEN - 1 VIEW COMPARISON:  None. FINDINGS: Dilated small bowel loops in the mid abdomen. There is gas in the stomach which is nondilated. Gasless colon. No acute skeletal abnormality. IMPRESSION: Dilated small bowel loops suggesting small-bowel obstruction. Electronically Signed   By: Marlan Palau M.D.   On: 12/26/2015 16:43      ASSESSMENT /  PLAN:  PULMONARY A: Aspiration likely associated with low lung volume secondary to acute abdominal distention. Acute hypoxemic resp failure. Doing well on BiPAP P:   BiPAP Recheck abg  CARDIOVASCULAR A:  History of coronary artery disease and status post pacemaker Circulatory hypotension - resolved with fluids. Likely dehydration due to nothing by mouth and small bowel obstruction P:  Aggressive fluid management Rule out MI  RENAL A:   Acute kidney injury secondary to dehydration P:   Volume repletion  GASTROINTESTINAL A:   Small bowel obstruction at admission associated with gastritis and ischemic P:   Protonix infusion Nothing by mouth NG tube to low intermittent suction   HEMATOLOGIC A:   Anemia due to bleeding and chronic illness P:  Transfuse if actively bleeding or if hemoglobin less than 7 g percent  INFECTIOUS A:   Possible aspiration P:   Zosyn  ENDOCRINE A:   At risk for hypoglycemia   P:   Monitor  NEUROLOGIC A:   Acute encephalopathy associated with hypotension and acidosis improving with fluids and BiPAP P:   RASS goal: 0 and currently 0 Monitor   FAMILY  - Updates:  Daughter Alona Bene updated at the bedside extensively  - Inter-disciplinary family meet or Palliative Care meeting due by:  Done 12/22/2015 at the bedside with nurse present. Daughter Alona Bene has worked in the [employer]. She wanted to know the outcome of CPR in an elderly patient in the ICU. I explained that the chances of good recovery and prognostic outcomes from CPR and an ICU patient that age 89 is poor. She wants to discuss this with her brother was on his way from [place]. Patient's husband died after being in hospice care 18 months ago and the daughter is not a worse to hospice concept but at baseline this patient is lived fairly well and functional and therefore this time remains full code looking for reversible etiologies  l    The patient is critically ill with  multiple organ systems failure and requires high complexity decision making for assessment and support, frequent evaluation and titration of therapies, application of advanced monitoring technologies and extensive interpretation of multiple databases.   Critical Care Time devoted to patient care services described in this note is  40  Minutes. This time reflects time of care of this signee Dr Kalman Shan. This critical care time does not reflect procedure time, or teaching time or supervisory time of PA/NP/Med student/Med Resident etc but could involve care discussion time    Dr. Kalman Shan, M.D., [facility].C.P Pulmonary and Critical Care Medicine Staff Physician [facility] Pager: [phone], If no answer or between  15:00h - 7:00h: call [phone]  12/22/2015 2:10 AM

## 2015-12-22 NOTE — Progress Notes (Signed)
CRITICAL VALUE ALERT  Critical value received:  Lactic Acid 3.3  Date of notification:  12-22-2015  Time of notification:  01:30  Critical value read back:Yes.    Nurse who received alert:  Edison Nasuti  MD notified (1st page): Karlyne Greenspan  Time of first page:  01:35  MD notified (2nd page): N/A  Time of second page: N/A  Responding MD:  Karlyne Greenspan  Time MD responded:  01:35

## 2015-12-22 NOTE — Progress Notes (Signed)
RT attempted to draw ABG and was unsuccessful. ABG cancelled per verbal order- Dr. Vassie Loll.

## 2015-12-22 NOTE — Progress Notes (Signed)
eLink Physician-Brief Progress Note Patient Name: KASHA HOWETH DOB: [birth year] MRN: [id number]   Date of Service  12/22/2015  HPI/Events of Note  Camera chk. Patient's hypotension resolved with low-dose peripheral vasopressor. Mental status improving.  eICU Interventions  Continue current plan of care.      Intervention Category Major Interventions: Change in mental status - evaluation and management  Lawanda Cousins 12/22/2015, 4:03 AM

## 2015-12-23 ENCOUNTER — Other Ambulatory Visit: Payer: Self-pay

## 2015-12-23 ENCOUNTER — Inpatient Hospital Stay (HOSPITAL_COMMUNITY): Payer: Medicare Other

## 2015-12-23 DIAGNOSIS — N289 Disorder of kidney and ureter, unspecified: Secondary | ICD-10-CM

## 2015-12-23 DIAGNOSIS — J9601 Acute respiratory failure with hypoxia: Secondary | ICD-10-CM

## 2015-12-23 DIAGNOSIS — K5669 Other intestinal obstruction: Secondary | ICD-10-CM

## 2015-12-23 LAB — GLUCOSE, CAPILLARY
GLUCOSE-CAPILLARY: 109 mg/dL — AB (ref 65–99)
GLUCOSE-CAPILLARY: 143 mg/dL — AB (ref 65–99)
Glucose-Capillary: 121 mg/dL — ABNORMAL HIGH (ref 65–99)
Glucose-Capillary: 180 mg/dL — ABNORMAL HIGH (ref 65–99)
Glucose-Capillary: 185 mg/dL — ABNORMAL HIGH (ref 65–99)

## 2015-12-23 LAB — CBC
HCT: 25.2 % — ABNORMAL LOW (ref 36.0–46.0)
Hemoglobin: 8.2 g/dL — ABNORMAL LOW (ref 12.0–15.0)
MCH: 31.3 pg (ref 26.0–34.0)
MCHC: 32.5 g/dL (ref 30.0–36.0)
MCV: 96.2 fL (ref 78.0–100.0)
PLATELETS: 98 10*3/uL — AB (ref 150–400)
RBC: 2.62 MIL/uL — AB (ref 3.87–5.11)
RDW: 14.2 % (ref 11.5–15.5)
WBC: 5.2 10*3/uL (ref 4.0–10.5)

## 2015-12-23 LAB — PROTIME-INR
INR: 2.79 — ABNORMAL HIGH (ref 0.00–1.49)
Prothrombin Time: 29 seconds — ABNORMAL HIGH (ref 11.6–15.2)

## 2015-12-23 LAB — HEMOGLOBIN A1C
HEMOGLOBIN A1C: 6.2 % — AB (ref 4.8–5.6)
MEAN PLASMA GLUCOSE: 131 mg/dL

## 2015-12-23 LAB — PROCALCITONIN: Procalcitonin: 37.83 ng/mL

## 2015-12-23 MED ORDER — IOHEXOL 300 MG/ML  SOLN
25.0000 mL | INTRAMUSCULAR | Status: AC
  Administered 2015-12-23 (×2): 25 mL via ORAL

## 2015-12-23 MED ORDER — CHLORHEXIDINE GLUCONATE 0.12 % MT SOLN
OROMUCOSAL | Status: AC
  Administered 2015-12-23: 15 mL via OROMUCOSAL
  Filled 2015-12-23: qty 15

## 2015-12-23 MED ORDER — DEXTROSE-NACL 5-0.9 % IV SOLN
INTRAVENOUS | Status: DC
  Administered 2015-12-23 – 2015-12-24 (×5): via INTRAVENOUS
  Administered 2015-12-25: 50 mL/h via INTRAVENOUS
  Administered 2015-12-25: 02:00:00 via INTRAVENOUS

## 2015-12-23 MED ORDER — METOCLOPRAMIDE HCL 5 MG/ML IJ SOLN
5.0000 mg | Freq: Three times a day (TID) | INTRAMUSCULAR | Status: AC
  Administered 2015-12-23 (×3): 5 mg via INTRAVENOUS
  Filled 2015-12-23 (×3): qty 2

## 2015-12-23 NOTE — Progress Notes (Signed)
PULMONARY / CRITICAL CARE MEDICINE   Name: [name] [name] MRN: [id number] DOB: 12-06-1920    ADMISSION DATE:  01/03/2016 CONSULTATION DATE:  01/11/2016  REFERRING MD:  [facility] hospitalist  CHIEF COMPLAINT:  Confusion, hypotension, abd distension  SUBJECTIVE:  RN reports 37 cc UOP in last 24 hours, 2.1L NGT drainage.  Last received fentanyl at 0100.  Hypoglycemia.     VITAL SIGNS: BP 92/59 mmHg  Pulse 69  Temp(Src) 97.2 F (36.2 C) (Core (Comment))  Resp 25  Ht  (1.575 m)  Wt 99 lb 6.8 oz (45.1 kg)  BMI 18.18 kg/m2  SpO2 100%  HEMODYNAMICS:    VENTILATOR SETTINGS: Vent Mode:  [-] PRVC FiO2 (%):  [40 %-80 %] 40 % Set Rate:  [22 bmp] 22 bmp Vt Set:  [400 mL] 400 mL PEEP:  [5 cmH20] 5 cmH20 Plateau Pressure:  [20 cmH20-23 cmH20] 23 cmH20  INTAKE / OUTPUT: I/O last 3 completed shifts: In: 6887.1 [I.V.:6537.1; IV Piggyback:350] Out: 2262 [Urine:112; Emesis/NG output:2150]  PHYSICAL EXAMINATION: General:  Frail elderly female in NAD on vent Neuro: no response to verbal stimuli, movement to pain but no follow commands HEENT:  ETT, mm pink/moist Cardiovascular: s1s2 rrr, no m/r/g Lungs:  Even/non-labored, lungs bilaterally coarse rhonchi Abdomen:  Mild distention, firm  Musculoskeletal:  No cyanosis. No clubbing. No edema Skin:  Intact, cool to touch  LABS: PULMONARY  Recent Labs Lab 12/22/15 0119 12/22/15 0345  PHART 7.223* 7.142*  PCO2ART 42.1 48.3*  PO2ART 106* 62.7*  HCO3 16.6* 15.8*  TCO2 16.3 16.1  O2SAT 96.6 84.6    CBC  Recent Labs Lab 01/10/2016 0230 12/26/2015 1600 12/22/15 0124 12/23/15 0410  HGB 11.2* 10.9* 9.4* 8.2*  HCT 33.8* 33.4* 29.1* 25.2*  WBC 4.6  --  3.4* 5.2  PLT 111*  --  116* 98*    COAGULATION  Recent Labs Lab 12/29/2015 1309 12/23/2015 0315 12/22/15 0124 12/23/15 0410  INR 1.90* 1.53* 1.76* 2.79*    CARDIAC  Recent Labs Lab 12/28/2015 1309 12/25/2015 2031 12/30/2015 0230 12/22/15 0124  TROPONINI 0.07* 0.11* 0.13* 0.09*    No results for input(s): PROBNP in the last 168 hours.   CHEMISTRY  Recent Labs Lab 12/17/2015 1309 12/22/2015 0315 12/22/15 0124  NA 140 140 145  K 4.1 4.6 4.3  CL 106 111 117*  CO2 19* 19* 18*  GLUCOSE 250* 131* 118*  BUN 43* 39* 47*  CREATININE 1.60* 1.53* 1.87*  CALCIUM 9.7 8.4* 8.2*  MG 2.2  --   --    Estimated Creatinine Clearance: 13.4 mL/min (by C-G formula based on Cr of 1.87).   LIVER  Recent Labs Lab 01/07/2016 1309 01/07/2016 0315 12/22/15 0124 12/23/15 0410  AST 36 30 42*  --   ALT 11* 10* 9*  --   ALKPHOS 48 34* 25*  --   BILITOT 0.9 0.6 0.3  --   PROT 7.5 5.9* 5.0*  --   ALBUMIN 4.1 3.2* 2.7*  --   INR 1.90* 1.53* 1.76* 2.79*    INFECTIOUS  Recent Labs Lab 12/22/15 0124 12/22/15 0130 12/23/15 0410  LATICACIDVEN 3.3*  --   --   PROCALCITON  --  7.80 37.83    ENDOCRINE CBG (last 3)   Recent Labs  12/22/15 2301 12/23/15 0502 12/23/15 0751  GLUCAP 189* 121* 109*    IMAGING x48h Portable Chest Xray  12/23/2015  CLINICAL DATA:  Respiratory failure. EXAM: PORTABLE CHEST 1 VIEW COMPARISON:  12/22/2015.  12/22/2015.  01/02/2016. FINDINGS:  Endotracheal tube tip 9 mm above the lower portion of the carina. Proximal repositioning of approximately 2 cm should be considered. NG tube noted with tip projected over the stomach. NG tube courses to the left in the chest this may be related to slight patient rotation and tortuous esophagus. Interim resolution of gastric distention. Distended loops of small bowel again noted. For further evaluation. Stable cardiomegaly. No pulmonary venous congestion. Low lung volumes with bibasilar atelectasis and/or infiltrates. No pneumothorax. No acute osseous abnormality. IMPRESSION: 1. Endotracheal tube noted with tip 9 mm above the lower portion of the carina. Proximal repositioning of approximately 2 cm should be considered. NG tube noted with tip projected over the stomach as above. 2. Low lung volumes with persistent  bibasilar atelectasis and/or infiltrates. 3. Interim resolution of gastric distention. Distended loops of small bowel again noted. Electronically Signed   By: Maisie Fus  Register   On: 12/23/2015 07:10   Dg Chest Port 1 View  12/22/2015  CLINICAL DATA:  82 year old female with abdominal pain EXAM: PORTABLE CHEST 1 VIEW COMPARISON:  Radiograph dated 01/08/2016 FINDINGS: There is a patchy area of opacity in the right mid and lower lung field compatible with atelectasis/ pneumonia. Small right pleural effusion may be present. The left lung is clear. No pneumothorax. There is cardiomegaly. Left pectoral pacemaker device. Degenerative changes the spine. IMPRESSION: Right lower lung field consolidation. Electronically Signed   By: Elgie Collard M.D.   On: 12/22/2015 01:56   Dg Abd Portable 1v  12/22/2015  CLINICAL DATA:  Acute onset of hypotension and hypoxic respiratory failure. Abdominal distention. Initial encounter. EXAM: PORTABLE ABDOMEN - 1 VIEW COMPARISON:  Abdominal radiograph performed 01/05/2016 FINDINGS: There is mildly worsening diffuse dilatation of small-bowel loops to 4.3 cm in maximal diameter, concerning for small bowel obstruction. The stomach is also filled with air. The colon is not well seen. No free intra-abdominal air is identified, though evaluation for free air is limited on a single supine view. No acute osseous abnormalities are identified. IMPRESSION: Mildly worsening diffuse dilatation of small-bowel loops to 4.3 cm in maximal diameter, concerning for worsening small bowel obstruction. No free intra-abdominal air seen. Electronically Signed   By: Roanna Raider M.D.   On: 12/22/2015 01:53    EVENTS / STUDIES: 2/04  Admit to [facility] with rectal bleeding, found to have SBO 2/05  EGD >> gastritis, concern for ischemia  2/06  CT ABD/Pelvis >>  2/06  ECHO >> LVEF 25-30%, diffuse hypokinesis, grade 1 diastolic dysfunction, left pleural effusion, moderate LAE, reduced RV function, mod TR, RVSP  30  LINES / TUBES: ETT 2/6 >>   ABX: Zosyn 2/5 >>   CULTURES:    DISCUSSION:  82 y/o F admitted with rectal bleeding 2/4.  KUB concerning for SBO.  EGD on 2/5 showed gastritis probably of ischemic variety.  She later became unresponsive with agonal breathing (sats in 40's), treated with 1L NS and bipap with some improvement.  Follow up imaging concerning for new RLL infiltrate and worsening SBO.  Family unable to agree on plan of care and patient intubated and placed on neo for hypotension.     ASSESSMENT / PLAN:  PULMONARY A: Suspected Aspiration - likely associated with low lung volume secondary to acute abdominal distention.  Acute hypoxemic resp failure - s/p intubation 2/5 P:   MV support, 8 cc/kg  Wean PEEP / FiO2 for sats > 90% Intermittent CXR  ABG PRN, RT having difficulty drawing further ABG's PRN Albuterol  Adjust  ETT  CARDIOVASCULAR A:  Hx CAD status post pacemaker Hypothermia Hypotension - initially resolved with fluids. Likely dehydration due to nothing by mouth and small bowel obstruction, MI ruled out P:  Neo for MAP >65, cap at 200 mcg.  No addition of other pressors. Hopeful to hold off on central line / aline due to risk of complications LCB - no CPR or cardioversion  Bair hugger as needed for normothermia Assess EKG in am to review QTc with Reglan  RENAL A:   Acute kidney injury - secondary to dehydration, decreased UOP  Lactic Acidosis  P:   Trend BMP / UOP  Replace electrolytes as indicated  Change MIVF to D5NS at 125 ml/hr  GASTROINTESTINAL A:   Rectal Bleeding / Melena  SBO - noted on KUB on admit Gastritis - noted on EGD, concern for ischemia P:   Protonix infusion x 72 hours, then BID PPI No further interventions per GI at this time, signed off Reglan 5 mg IV Q8 x 3 doses NPO  OGT to LIS for gastric decompression  Follow up CT ABD/Pelvis   HEMATOLOGIC A:   Anemia - due to bleeding and chronic illness Coagulopathy - in setting  of coumadin  P:  Trend CBC SCD's for DVT prophylaxis  Transfuse if actively bleeding or if hemoglobin less than 7 g percent Monitor INR S/p Vitamin K   INFECTIOUS A:   Possible aspiration P:   Empiric abx as above Trend fever curve / WBC / PCT   ENDOCRINE A:   Hypoglycemia   P:   Monitor glucose on BMP Adjust IVF as above  NEUROLOGIC A:   Acute encephalopathy - in setting of SBO, AMS, hypotension P:   RASS goal: 0  Serial neuro exams  Minimize fentanyl as able with SBO PRN versed for sedation   FAMILY  - Updates:  Daughter Alona Bene updated at the bedside extensively, await sons arrival  - Inter-disciplinary family meet or Palliative Care meeting due by:   2/7 pending update with Son & Daughter on arrival.   Canary Brim, NP-C [facility] Pgr: [phone] or if no answer [phone] 12/23/2015, 8:49 AM

## 2015-12-23 NOTE — Consult Note (Deleted)
PULMONARY / CRITICAL CARE MEDICINE   Name: [name] [name] MRN: [id number] DOB: Mar 26, 1921    ADMISSION DATE:  12/26/2015 CONSULTATION DATE:  01/01/2016  REFERRING MD:  [facility]  CHIEF COMPLAINT:  Confusion, hypotension, abd distension  SUBJECTIVE:  RN reports 37 cc UOP in last 24 hours, 2.1L NGT drainage.  Last received fentanyl at 0100.  Hypoglycemia.     VITAL SIGNS: BP 92/59 mmHg  Pulse 69  Temp(Src) 97.2 F (36.2 C) (Core (Comment))  Resp 25  Ht  (1.575 m)  Wt 99 lb 6.8 oz (45.1 kg)  BMI 18.18 kg/m2  SpO2 100%  HEMODYNAMICS:    VENTILATOR SETTINGS: Vent Mode:  [-] PRVC FiO2 (%):  [40 %-80 %] 40 % Set Rate:  [22 bmp] 22 bmp Vt Set:  [400 mL] 400 mL PEEP:  [5 cmH20] 5 cmH20 Plateau Pressure:  [20 cmH20-23 cmH20] 23 cmH20  INTAKE / OUTPUT: I/O last 3 completed shifts: In: 6887.1 [I.V.:6537.1; IV Piggyback:350] Out: 2262 [Urine:112; Emesis/NG output:2150]  PHYSICAL EXAMINATION: General:  Frail elderly female in NAD on vent Neuro: no response to verbal stimuli, movement to pain but no follow commands HEENT:  ETT, mm pink/moist Cardiovascular: s1s2 rrr, no m/r/g Lungs:  Even/non-labored, lungs bilaterally coarse rhonchi Abdomen:  Mild distention, firm  Musculoskeletal:  No cyanosis. No clubbing. No edema Skin:  Intact, cool to touch  LABS: PULMONARY  Recent Labs Lab 12/22/15 0119 12/22/15 0345  PHART 7.223* 7.142*  PCO2ART 42.1 48.3*  PO2ART 106* 62.7*  HCO3 16.6* 15.8*  TCO2 16.3 16.1  O2SAT 96.6 84.6    CBC  Recent Labs Lab 01-01-2016 0230 2016-01-01 1600 12/22/15 0124 12/23/15 0410  HGB 11.2* 10.9* 9.4* 8.2*  HCT 33.8* 33.4* 29.1* 25.2*  WBC 4.6  --  3.4* 5.2  PLT 111*  --  116* 98*    COAGULATION  Recent Labs Lab 01/01/2016 1309 2016-01-01 0315 12/22/15 0124 12/23/15 0410  INR 1.90* 1.53* 1.76* 2.79*    CARDIAC  Recent Labs Lab 01/11/2016 1309 12/18/2015 2031 2016-01-01 0230 12/22/15 0124  TROPONINI 0.07* 0.11* 0.13* 0.09*    No results for input(s): PROBNP in the last 168 hours.   CHEMISTRY  Recent Labs Lab 12/19/2015 1309 01/01/16 0315 12/22/15 0124  NA 140 140 145  K 4.1 4.6 4.3  CL 106 111 117*  CO2 19* 19* 18*  GLUCOSE 250* 131* 118*  BUN 43* 39* 47*  CREATININE 1.60* 1.53* 1.87*  CALCIUM 9.7 8.4* 8.2*  MG 2.2  --   --    Estimated Creatinine Clearance: 13.4 mL/min (by C-G formula based on Cr of 1.87).   LIVER  Recent Labs Lab 12/31/2015 1309 01-01-2016 0315 12/22/15 0124 12/23/15 0410  AST 36 30 42*  --   ALT 11* 10* 9*  --   ALKPHOS 48 34* 25*  --   BILITOT 0.9 0.6 0.3  --   PROT 7.5 5.9* 5.0*  --   ALBUMIN 4.1 3.2* 2.7*  --   INR 1.90* 1.53* 1.76* 2.79*    INFECTIOUS  Recent Labs Lab 12/22/15 0124 12/22/15 0130 12/23/15 0410  LATICACIDVEN 3.3*  --   --   PROCALCITON  --  7.80 37.83    ENDOCRINE CBG (last 3)   Recent Labs  12/22/15 2301 12/23/15 0502 12/23/15 0751  GLUCAP 189* 121* 109*    IMAGING x48h Portable Chest Xray  12/23/2015  CLINICAL DATA:  Respiratory failure. EXAM: PORTABLE CHEST 1 VIEW COMPARISON:  12/22/2015.  12/22/2015.  12/26/2015. FINDINGS:  Endotracheal tube tip 9 mm above the lower portion of the carina. Proximal repositioning of approximately 2 cm should be considered. NG tube noted with tip projected over the stomach. NG tube courses to the left in the chest this may be related to slight patient rotation and tortuous esophagus. Interim resolution of gastric distention. Distended loops of small bowel again noted. For further evaluation. Stable cardiomegaly. No pulmonary venous congestion. Low lung volumes with bibasilar atelectasis and/or infiltrates. No pneumothorax. No acute osseous abnormality. IMPRESSION: 1. Endotracheal tube noted with tip 9 mm above the lower portion of the carina. Proximal repositioning of approximately 2 cm should be considered. NG tube noted with tip projected over the stomach as above. 2. Low lung volumes with persistent  bibasilar atelectasis and/or infiltrates. 3. Interim resolution of gastric distention. Distended loops of small bowel again noted. Electronically Signed   By: Maisie Fus  Register   On: 12/23/2015 07:10   Dg Chest Port 1 View  12/22/2015  CLINICAL DATA:  82 year old female with abdominal pain EXAM: PORTABLE CHEST 1 VIEW COMPARISON:  Radiograph dated 12/25/2015 FINDINGS: There is a patchy area of opacity in the right mid and lower lung field compatible with atelectasis/ pneumonia. Small right pleural effusion may be present. The left lung is clear. No pneumothorax. There is cardiomegaly. Left pectoral pacemaker device. Degenerative changes the spine. IMPRESSION: Right lower lung field consolidation. Electronically Signed   By: Elgie Collard M.D.   On: 12/22/2015 01:56   Dg Abd Portable 1v  12/22/2015  CLINICAL DATA:  Acute onset of hypotension and hypoxic respiratory failure. Abdominal distention. Initial encounter. EXAM: PORTABLE ABDOMEN - 1 VIEW COMPARISON:  Abdominal radiograph performed 01/02/2016 FINDINGS: There is mildly worsening diffuse dilatation of small-bowel loops to 4.3 cm in maximal diameter, concerning for small bowel obstruction. The stomach is also filled with air. The colon is not well seen. No free intra-abdominal air is identified, though evaluation for free air is limited on a single supine view. No acute osseous abnormalities are identified. IMPRESSION: Mildly worsening diffuse dilatation of small-bowel loops to 4.3 cm in maximal diameter, concerning for worsening small bowel obstruction. No free intra-abdominal air seen. Electronically Signed   By: Roanna Raider M.D.   On: 12/22/2015 01:53    EVENTS / STUDIES: 2/04  Admit to [facility] with rectal bleeding, found to have SBO 2/05  EGD >> gastritis, concern for ischemia  2/06  CT ABD/Pelvis >>   LINES / TUBES: ETT 2/6 >>   ABX: Zosyn 2/5 >>   CULTURES:    DISCUSSION:  82 y/o F admitted with rectal bleeding 2/4.  KUB concerning for  SBO.  EGD on 2/5 showed gastritis probably of ischemic variety.  She later became unresponsive with agonal breathing (sats in 40's), treated with 1L NS and bipap with some improvement.  Follow up imaging concerning for new RLL infiltrate and worsening SBO.  Family unable to agree on plan of care and patient intubated and placed on neo for hypotension.     ASSESSMENT / PLAN:  PULMONARY A: Suspected Aspiration - likely associated with low lung volume secondary to acute abdominal distention.  Acute hypoxemic resp failure - s/p intubation 2/5 P:   MV support, 8 cc/kg  Wean PEEP / FiO2 for sats > 90% Intermittent CXR  ABG PRN, RT having difficulty drawing further ABG's PRN Albuterol   CARDIOVASCULAR A:  Hx CAD status post pacemaker Hypothermia Hypotension - initially resolved with fluids. Likely dehydration due to nothing by mouth and small  bowel obstruction, MI ruled out P:  Neo for MAP >65, cap at 200 mcg.  No addition of other pressors. Hopeful to hold off on central line / aline due to risk of complications LCB - no CPR or cardioversion  Bair hugger as needed for normothermia Assess EKG in am to review QTc with Reglan  RENAL A:   Acute kidney injury - secondary to dehydration, decreased UOP  Lactic Acidosis  P:   Trend BMP / UOP  Replace electrolytes as indicated  Change MIVF to D5NS at 125 ml/hr  GASTROINTESTINAL A:   Rectal Bleeding / Melena  SBO - noted on KUB on admit Gastritis - noted on EGD, concern for ischemia P:   Protonix infusion x 72 hours, then BID PPI No further interventions per GI at this time, signed off Reglan 5 mg IV Q8 x 3 doses NPO  OGT to LIS for gastric decompression  Follow up CT ABD/Pelvis   HEMATOLOGIC A:   Anemia - due to bleeding and chronic illness Coagulopathy - in setting of coumadin  P:  Trend CBC SCD's for DVT prophylaxis  Transfuse if actively bleeding or if hemoglobin less than 7 g percent Monitor INR S/p Vitamin K    INFECTIOUS A:   Possible aspiration P:   Empiric abx as above Trend fever curve / WBC / PCT   ENDOCRINE A:   Hypoglycemia   P:   Monitor glucose on BMP Adjust IVF as above  NEUROLOGIC A:   Acute encephalopathy - in setting of SBO, AMS, hypotension P:   RASS goal: 0  Serial neuro exams  Minimize fentanyl as able with SBO PRN versed for sedation   FAMILY  - Updates:  Daughter Alona Bene updated at the bedside extensively, await sons arrival  - Inter-disciplinary family meet or Palliative Care meeting due by:   2/7 pending update with Son & Daughter on arrival.   Canary Brim, NP-C [facility] Pgr: [phone] or if no answer [phone] 12/23/2015, 8:19 AM

## 2015-12-23 NOTE — Progress Notes (Signed)
Patient transported to CT on full support and FiO2 100%. Patient tolerated transport and procedure with no complications noted. Patient returned to room and is comfortable at this time. RT will continue to monitor patient.

## 2015-12-24 ENCOUNTER — Inpatient Hospital Stay (HOSPITAL_COMMUNITY): Payer: Medicare Other

## 2015-12-24 DIAGNOSIS — K921 Melena: Principal | ICD-10-CM

## 2015-12-24 LAB — GLUCOSE, CAPILLARY
GLUCOSE-CAPILLARY: 151 mg/dL — AB (ref 65–99)
GLUCOSE-CAPILLARY: 169 mg/dL — AB (ref 65–99)
GLUCOSE-CAPILLARY: 179 mg/dL — AB (ref 65–99)
GLUCOSE-CAPILLARY: 198 mg/dL — AB (ref 65–99)
Glucose-Capillary: 157 mg/dL — ABNORMAL HIGH (ref 65–99)
Glucose-Capillary: 187 mg/dL — ABNORMAL HIGH (ref 65–99)
Glucose-Capillary: 146 mg/dL — ABNORMAL HIGH (ref 65–99)

## 2015-12-24 MED ORDER — INSULIN ASPART 100 UNIT/ML ~~LOC~~ SOLN
0.0000 [IU] | SUBCUTANEOUS | Status: DC
  Administered 2015-12-24 (×3): 2 [IU] via SUBCUTANEOUS
  Administered 2015-12-24 – 2015-12-25 (×3): 1 [IU] via SUBCUTANEOUS
  Administered 2015-12-25: 2 [IU] via SUBCUTANEOUS
  Administered 2015-12-25 (×2): 1 [IU] via SUBCUTANEOUS
  Administered 2015-12-26: 5 [IU] via SUBCUTANEOUS
  Administered 2015-12-26: 9 [IU] via SUBCUTANEOUS

## 2015-12-24 NOTE — Progress Notes (Signed)
Epic Vent flow tab down, pt seen for 0400 VENT check at 0415. Current Vent settings are PRVC 400, RR 22, peep 5 and 40%.  Pt meets wean screen at this time, RT to monitor and assess as needed.

## 2015-12-24 NOTE — Progress Notes (Signed)
PULMONARY / CRITICAL CARE MEDICINE   Name: [name] [name] MRN: [id number] DOB: [birth year]    ADMISSION DATE:  Jan 05, 2016 CONSULTATION DATE:  12/22/2015  REFERRING MD:  [facility]  CHIEF COMPLAINT:  Confusion, hypotension, abd distension admitted 2/4 with small bowel obstruction and rectal bleeding EGD showing gastritis? Ischemic, required emergent intubation 2/6 AM for unresponsiveness now on pressors for hypotension-Course very concerning for bowel ischemia   SUBJECTIVE: afebrile Poorly responsive Poor UO  VITAL SIGNS: BP 68/48 mmHg  Pulse 75  Temp(Src) 98.2 F (36.8 C) (Core (Comment))  Resp 34  Ht  (1.575 m)  Wt 99 lb 6.8 oz (45.1 kg)  BMI 18.18 kg/m2  SpO2 100%  HEMODYNAMICS:    VENTILATOR SETTINGS: Vent Mode:  [-] PRVC FiO2 (%):  [40 %] 40 % Set Rate:  [22 bmp] 22 bmp Vt Set:  [400 mL] 400 mL PEEP:  [5 cmH20] 5 cmH20 Plateau Pressure:  [20 cmH20-23 cmH20] 23 cmH20  INTAKE / OUTPUT: I/O last 3 completed shifts: In: 8234.2 [I.V.:7854.2; NG/GT:30; IV Piggyback:350] Out: 3474 [Urine:49; Emesis/NG output:3425]  PHYSICAL EXAMINATION: General:  Frail elderly female in NAD on vent Neuro: no response to verbal stimuli, movement to pain but no follow commands HEENT:  ETT, mm pink/moist Cardiovascular: s1s2 rrr, no m/r/g Lungs:  Even/non-labored, lungs bilaterally coarse rhonchi Abdomen:  Mild distention, firm  Musculoskeletal:  No cyanosis. No clubbing. No edema Skin:  Intact, cool to touch  LABS: PULMONARY  Recent Labs Lab 12/22/15 0119 12/22/15 0345  PHART 7.223* 7.142*  PCO2ART 42.1 48.3*  PO2ART 106* 62.7*  HCO3 16.6* 15.8*  TCO2 16.3 16.1  O2SAT 96.6 84.6    CBC  Recent Labs Lab 12/24/2015 0230 12/30/2015 1600 12/22/15 0124 12/23/15 0410  HGB 11.2* 10.9* 9.4* 8.2*  HCT 33.8* 33.4* 29.1* 25.2*  WBC 4.6  --  3.4* 5.2  PLT 111*  --  116* 98*    COAGULATION  Recent Labs Lab 01/05/16 1309 12/22/2015 0315 12/22/15 0124 12/23/15 0410   INR 1.90* 1.53* 1.76* 2.79*    CARDIAC  Recent Labs Lab 01/05/16 1309 January 05, 2016 2031 12/28/2015 0230 12/22/15 0124  TROPONINI 0.07* 0.11* 0.13* 0.09*   No results for input(s): PROBNP in the last 168 hours.   CHEMISTRY  Recent Labs Lab 2016-01-05 1309 12/22/2015 0315 12/22/15 0124  NA 140 140 145  K 4.1 4.6 4.3  CL 106 111 117*  CO2 19* 19* 18*  GLUCOSE 250* 131* 118*  BUN 43* 39* 47*  CREATININE 1.60* 1.53* 1.87*  CALCIUM 9.7 8.4* 8.2*  MG 2.2  --   --    Estimated Creatinine Clearance: 13.4 mL/min (by C-G formula based on Cr of 1.87).   LIVER  Recent Labs Lab 01/05/2016 1309 01/02/2016 0315 12/22/15 0124 12/23/15 0410  AST 36 30 42*  --   ALT 11* 10* 9*  --   ALKPHOS 48 34* 25*  --   BILITOT 0.9 0.6 0.3  --   PROT 7.5 5.9* 5.0*  --   ALBUMIN 4.1 3.2* 2.7*  --   INR 1.90* 1.53* 1.76* 2.79*    INFECTIOUS  Recent Labs Lab 12/22/15 0124 12/22/15 0130 12/23/15 0410  LATICACIDVEN 3.3*  --   --   PROCALCITON  --  7.80 37.83    ENDOCRINE CBG (last 3)   Recent Labs  12/23/15 2331 12/24/15 0312 12/24/15 0829  GLUCAP 157* 179* 187*    IMAGING x48h Ct Abdomen Pelvis Wo Contrast  12/23/2015  CLINICAL DATA:  Small-bowel  obstruction EXAM: CT ABDOMEN AND PELVIS WITHOUT CONTRAST TECHNIQUE: Multidetector CT imaging of the abdomen and pelvis was performed following the standard protocol without IV contrast. COMPARISON:  12/22/2015 FINDINGS: Lung bases demonstrate bilateral pleural effusions and bilateral lower lobe consolidation. These changes have worsened in the interval from previous exams. Mild ascites is noted surrounding the liver. The liver, gallbladder, spleen, adrenal glands and pancreas appear within normal limits. The kidneys are well visualized bilaterally. A mildly complicated low right renal cyst is noted measuring 3.0 cm in greatest dimension. No renal calculi or obstructive changes are seen. Aortoiliac calcifications are noted. The density within the  aorta is decreased and may represent some degree of anemia. The bladder is decompressed by Foley catheter. Mild anasarca is seen. A nasogastric catheter is noted within the stomach. There is dilatation of the small bowel identified within the jejunum and proximal and mid ileum. There is a focal transition zone in the midline best seen on image number 52 of series 2 and image number 32 of series 602. No definitive mass lesion is seen an these changes likely represent adhesions some filling defects are noted in the proximal jejunum which do not appear to be obstructive in nature and may represent ingested material or thrombus given the history of gastritis. No acute bony abnormality is seen. IMPRESSION: Small-bowel obstruction in the mid ileum without definitive mass lesion. Bilateral pleural effusions and lower lobe consolidation. Mildly complicated right renal cyst. Electronically Signed   By: Alcide Clever M.D.   On: 12/23/2015 15:57   Dg Chest Port 1 View  12/24/2015  CLINICAL DATA:  Respiratory failure EXAM: PORTABLE CHEST 1 VIEW COMPARISON:  12/23/2015 FINDINGS: Endotracheal tube low 1 cm above the carina. This is unchanged. Gastric tube coiled in the stomach. Right lower lobe consolidation mildly progressed. Probable small right effusion. Left lower lobe consolidation and small left effusion unchanged. IMPRESSION: Endotracheal tube 1 cm above the carina recommend withdrawal of 2-3 cm. Bibasilar consolidation and effusion with progression on the right. Electronically Signed   By: Marlan Palau M.D.   On: 12/24/2015 07:04   Portable Chest Xray  12/23/2015  CLINICAL DATA:  Respiratory failure. EXAM: PORTABLE CHEST 1 VIEW COMPARISON:  12/22/2015.  12/22/2015.  12/26/2015. FINDINGS: Endotracheal tube tip 9 mm above the lower portion of the carina. Proximal repositioning of approximately 2 cm should be considered. NG tube noted with tip projected over the stomach. NG tube courses to the left in the chest this may  be related to slight patient rotation and tortuous esophagus. Interim resolution of gastric distention. Distended loops of small bowel again noted. For further evaluation. Stable cardiomegaly. No pulmonary venous congestion. Low lung volumes with bibasilar atelectasis and/or infiltrates. No pneumothorax. No acute osseous abnormality. IMPRESSION: 1. Endotracheal tube noted with tip 9 mm above the lower portion of the carina. Proximal repositioning of approximately 2 cm should be considered. NG tube noted with tip projected over the stomach as above. 2. Low lung volumes with persistent bibasilar atelectasis and/or infiltrates. 3. Interim resolution of gastric distention. Distended loops of small bowel again noted. Electronically Signed   By: Maisie Fus  Register   On: 12/23/2015 07:10    EVENTS / STUDIES: 2/04  Admit to [facility] with rectal bleeding, found to have SBO 2/05  EGD >> gastritis, concern for ischemia  2/06  CT ABD/Pelvis >>  2/06  ECHO >> LVEF 25-30%, diffuse hypokinesis, grade 1 diastolic dysfunction, left pleural effusion, moderate LAE, reduced RV function, mod TR, RVSP 30  2/7 CT abd, no contrast  >> SBO   LINES / TUBES: ETT 2/6 >>   ABX: Zosyn 2/5 >>   CULTURES:    DISCUSSION:  82 y/o F admitted with rectal bleeding 2/4.  KUB concerning for SBO.  EGD on 2/5 showed gastritis probably of ischemic variety.  She later became unresponsive with agonal breathing (sats in 40's), treated with 1L NS and bipap with some improvement.  Follow up imaging concerning for new RLL infiltrate and worsening SBO.  Family unable to agree on plan of care and patient intubated and placed on neo for hypotension.     ASSESSMENT / PLAN:  PULMONARY A: Suspected Aspiration - likely associated with low lung volume secondary to acute abdominal distention.  Acute hypoxemic resp failure - s/p intubation 2/5 P:   MV support, 8 cc/kg  PRN Albuterol    CARDIOVASCULAR A:  Hx CAD status post  pacemaker Hypothermia Hypotension - initially resolved with fluids. Likely dehydration due to nothing by mouth and small bowel obstruction, MI ruled out P:  Neo for MAP >65, cap at 200 mcg.  No addition of other pressors. LCB - no CPR or cardioversion  Bair hugger as needed for normothermia  RENAL A:   Acute kidney injury - secondary to dehydration, decreased UOP  Lactic Acidosis  P:   Trend BMP / UOP  Replace electrolytes as indicated  Change MIVF to D5NS at 125 ml/hr  GASTROINTESTINAL A:   Rectal Bleeding / Melena  SBO - noted on KUB on admit Gastritis - noted on EGD, concern for bowel ischemia P:    BID PPI No further interventions per GI at this time, signed off Reglan 5 mg IV Q8 x 3 doses NPO  OGT to LIS for gastric decompression    HEMATOLOGIC A:   Anemia - due to bleeding and chronic illness Coagulopathy - in setting of coumadin S/p Vitamin K  P:  Trend CBC SCD's for DVT prophylaxis  Transfuse if actively bleeding or if hemoglobin less than 7 g percent Monitor INR   INFECTIOUS A:   Possible aspiration P:   Empiric abx as above Trend fever curve / WBC / PCT   ENDOCRINE A:   Hypoglycemia   P:   Monitor glucose on BMP Adjust IVF as above  NEUROLOGIC A:   Acute encephalopathy - in setting of SBO, AMS, hypotension P:   RASS goal: 0  Serial neuro exams  Minimize fentanyl as able with SBO PRN versed for sedation   FAMILY  - Updates:  Daughter Alona Bene updated at the bedside extensively,she is agreeable to withdrawal, son Greggory Stallion is hesitant & hopeful  - Inter-disciplinary family meet or Palliative Care meeting due by:   2/7 pending update with Son & Daughter on arrival.  The patient is critically ill with multiple organ systems failure and requires high complexity decision making for assessment and support, frequent evaluation and titration of therapies, application of advanced monitoring technologies and extensive interpretation of multiple  databases. Critical Care Time devoted to patient care services described in this note independent of APP time is 32 minutes.    Cyril Mourning MD. Tonny Bollman. [facility] Pager [phone] If no response call [phone]    12/24/2015, 9:54 AM

## 2015-12-25 DIAGNOSIS — R6521 Severe sepsis with septic shock: Secondary | ICD-10-CM

## 2015-12-25 DIAGNOSIS — A419 Sepsis, unspecified organism: Secondary | ICD-10-CM

## 2015-12-25 LAB — GLUCOSE, CAPILLARY
GLUCOSE-CAPILLARY: 133 mg/dL — AB (ref 65–99)
GLUCOSE-CAPILLARY: 145 mg/dL — AB (ref 65–99)
GLUCOSE-CAPILLARY: 191 mg/dL — AB (ref 65–99)
Glucose-Capillary: 121 mg/dL — ABNORMAL HIGH (ref 65–99)
Glucose-Capillary: 132 mg/dL — ABNORMAL HIGH (ref 65–99)

## 2015-12-25 MED ORDER — PIPERACILLIN-TAZOBACTAM IN DEX 2-0.25 GM/50ML IV SOLN
2.2500 g | Freq: Three times a day (TID) | INTRAVENOUS | Status: DC
Start: 2015-12-25 — End: 2015-12-27
  Administered 2015-12-25 – 2015-12-26 (×4): 2.25 g via INTRAVENOUS
  Filled 2015-12-25 (×6): qty 50

## 2015-12-25 MED ORDER — SODIUM CHLORIDE 0.9 % IV BOLUS (SEPSIS)
1000.0000 mL | Freq: Once | INTRAVENOUS | Status: AC
  Administered 2015-12-25: 1000 mL via INTRAVENOUS

## 2015-12-25 MED ORDER — LIP MEDEX EX OINT
TOPICAL_OINTMENT | CUTANEOUS | Status: AC
  Administered 2015-12-25: 08:00:00
  Filled 2015-12-25: qty 7

## 2015-12-25 MED ORDER — DEXTROSE-NACL 5-0.9 % IV SOLN: INTRAVENOUS | Status: DC

## 2015-12-25 NOTE — Progress Notes (Signed)
PULMONARY / CRITICAL CARE MEDICINE   Name: [name] [name] MRN: [id number] DOB: October 12, 1921    ADMISSION DATE:  12/22/2015 CONSULTATION DATE:  01/11/2016  REFERRING MD:  [facility] hospitalist  CHIEF COMPLAINT:  Confusion, hypotension, abd distension admitted 2/4 with small bowel obstruction and rectal bleeding EGD showing gastritis? Ischemic, required emergent intubation 2/6 AM for unresponsiveness now on pressors for hypotension-Course very concerning for bowel ischemia   SUBJECTIVE:  afebrile Remains unresponsive Poor UO  VITAL SIGNS: BP 60/17 mmHg  Pulse 69  Temp(Src) 96.3 F (35.7 C) (Core (Comment))  Resp 27  Ht  (1.575 m)  Wt 99 lb 6.8 oz (45.1 kg)  BMI 18.18 kg/m2  SpO2 100%  HEMODYNAMICS:    VENTILATOR SETTINGS: Vent Mode:  [-] PRVC FiO2 (%):  [40 %] 40 % Set Rate:  [22 bmp] 22 bmp Vt Set:  [400 mL] 400 mL PEEP:  [5 cmH20] 5 cmH20 Plateau Pressure:  [16 cmH20-23 cmH20] 16 cmH20  INTAKE / OUTPUT: I/O last 3 completed shifts: In: 8320.4 [I.V.:8120.4; IV Piggyback:200] Out: 2004 [Urine:29; Emesis/NG output:1975]  PHYSICAL EXAMINATION: General:  Frail elderly female in NAD on vent Neuro: no response to verbal stimuli, movement to pain but no follow commands HEENT:  ETT, mm pink/moist Cardiovascular: s1s2 rrr, no m/r/g Lungs:  Even/non-labored, lungs bilaterally coarse rhonchi Abdomen:  Mild distention, firm  Musculoskeletal:  No cyanosis. No clubbing. No edema Skin:  blisters, cool to touch  LABS: PULMONARY  Recent Labs Lab 12/22/15 0119 12/22/15 0345  PHART 7.223* 7.142*  PCO2ART 42.1 48.3*  PO2ART 106* 62.7*  HCO3 16.6* 15.8*  TCO2 16.3 16.1  O2SAT 96.6 84.6    CBC  Recent Labs Lab 12/26/2015 0230 12/26/2015 1600 12/22/15 0124 12/23/15 0410  HGB 11.2* 10.9* 9.4* 8.2*  HCT 33.8* 33.4* 29.1* 25.2*  WBC 4.6  --  3.4* 5.2  PLT 111*  --  116* 98*    COAGULATION  Recent Labs Lab 01/12/2016 1309 01/05/2016 0315 12/22/15 0124 12/23/15 0410  INR  1.90* 1.53* 1.76* 2.79*    CARDIAC  Recent Labs Lab 12/26/2015 1309 01/09/2016 2031 12/28/2015 0230 12/22/15 0124  TROPONINI 0.07* 0.11* 0.13* 0.09*   No results for input(s): PROBNP in the last 168 hours.   CHEMISTRY  Recent Labs Lab 01/04/2016 1309 01/10/2016 0315 12/22/15 0124  NA 140 140 145  K 4.1 4.6 4.3  CL 106 111 117*  CO2 19* 19* 18*  GLUCOSE 250* 131* 118*  BUN 43* 39* 47*  CREATININE 1.60* 1.53* 1.87*  CALCIUM 9.7 8.4* 8.2*  MG 2.2  --   --    Estimated Creatinine Clearance: 13.4 mL/min (by C-G formula based on Cr of 1.87).   LIVER  Recent Labs Lab 12/25/2015 1309 01/12/2016 0315 12/22/15 0124 12/23/15 0410  AST 36 30 42*  --   ALT 11* 10* 9*  --   ALKPHOS 48 34* 25*  --   BILITOT 0.9 0.6 0.3  --   PROT 7.5 5.9* 5.0*  --   ALBUMIN 4.1 3.2* 2.7*  --   INR 1.90* 1.53* 1.76* 2.79*    INFECTIOUS  Recent Labs Lab 12/22/15 0124 12/22/15 0130 12/23/15 0410  LATICACIDVEN 3.3*  --   --   PROCALCITON  --  7.80 37.83    ENDOCRINE CBG (last 3)   Recent Labs  12/24/15 2005 12/24/15 2332 12/25/15 0317  GLUCAP 146* 151* 121*    IMAGING x48h Ct Abdomen Pelvis Wo Contrast  12/23/2015  CLINICAL DATA:  Small-bowel  obstruction EXAM: CT ABDOMEN AND PELVIS WITHOUT CONTRAST TECHNIQUE: Multidetector CT imaging of the abdomen and pelvis was performed following the standard protocol without IV contrast. COMPARISON:  12/22/2015 FINDINGS: Lung bases demonstrate bilateral pleural effusions and bilateral lower lobe consolidation. These changes have worsened in the interval from previous exams. Mild ascites is noted surrounding the liver. The liver, gallbladder, spleen, adrenal glands and pancreas appear within normal limits. The kidneys are well visualized bilaterally. A mildly complicated low right renal cyst is noted measuring 3.0 cm in greatest dimension. No renal calculi or obstructive changes are seen. Aortoiliac calcifications are noted. The density within the aorta  is decreased and may represent some degree of anemia. The bladder is decompressed by Foley catheter. Mild anasarca is seen. A nasogastric catheter is noted within the stomach. There is dilatation of the small bowel identified within the jejunum and proximal and mid ileum. There is a focal transition zone in the midline best seen on image number 52 of series 2 and image number 32 of series 602. No definitive mass lesion is seen an these changes likely represent adhesions some filling defects are noted in the proximal jejunum which do not appear to be obstructive in nature and may represent ingested material or thrombus given the history of gastritis. No acute bony abnormality is seen. IMPRESSION: Small-bowel obstruction in the mid ileum without definitive mass lesion. Bilateral pleural effusions and lower lobe consolidation. Mildly complicated right renal cyst. Electronically Signed   By: Alcide Clever M.D.   On: 12/23/2015 15:57   Dg Chest Port 1 View  12/24/2015  CLINICAL DATA:  Respiratory failure EXAM: PORTABLE CHEST 1 VIEW COMPARISON:  12/23/2015 FINDINGS: Endotracheal tube low 1 cm above the carina. This is unchanged. Gastric tube coiled in the stomach. Right lower lobe consolidation mildly progressed. Probable small right effusion. Left lower lobe consolidation and small left effusion unchanged. IMPRESSION: Endotracheal tube 1 cm above the carina recommend withdrawal of 2-3 cm. Bibasilar consolidation and effusion with progression on the right. Electronically Signed   By: Marlan Palau M.D.   On: 12/24/2015 07:04    EVENTS / STUDIES: 2/04  Admit to [facility] with rectal bleeding, found to have SBO 2/05  EGD >> gastritis, concern for ischemia  2/06  CT ABD/Pelvis >>  2/06  ECHO >> LVEF 25-30%, diffuse hypokinesis, grade 1 diastolic dysfunction, left pleural effusion, moderate LAE, reduced RV function, mod TR, RVSP 30 2/7 CT abd, no contrast  >> SBO   LINES / TUBES: ETT 2/6 >>   ABX: Zosyn 2/5 >>    CULTURES:    DISCUSSION:  82 y/o F admitted with rectal bleeding 2/4.  KUB concerning for SBO.  EGD on 2/5 showed gastritis probably of ischemic variety.  She later became unresponsive with agonal breathing (sats in 40's), treated with 1L NS and bipap with some improvement.  Follow up imaging concerning for new RLL infiltrate and worsening SBO.   patient intubated and placed on neo for hypotension.     ASSESSMENT / PLAN:  PULMONARY A: Suspected Aspiration - likely associated with low lung volume secondary to acute abdominal distention.  Acute hypoxemic resp failure - s/p intubation 2/5 P:   MV support, 8 cc/kg  PRN Albuterol    CARDIOVASCULAR A:  Hx CAD status post pacemaker Hypothermia Hypotension - initially resolved with fluids. Likely dehydration due to nothing by mouth and small bowel obstruction, MI ruled out P:  Neo for MAP >65, cap at 200 mcg.  No addition  of other pressors. LCB - no CPR or cardioversion  Bair hugger as needed for normothermia  RENAL A:   Acute kidney injury - secondary to dehydration, decreased UOP  Lactic Acidosis  P:   Trend BMP / UOP  Replace electrolytes as indicated  drop D5NS at 50 ml/hr  GASTROINTESTINAL A:   Rectal Bleeding / Melena  SBO - noted on KUB on admit Gastritis - noted on EGD, concern for bowel ischemia P:    BID PPI No further interventions per GI at this time, signed off NPO  OGT to LIS for gastric decompression    HEMATOLOGIC A:   Anemia - due to bleeding and chronic illness Coagulopathy - in setting of coumadin S/p Vitamin K  P:  Trend CBC SCD's for DVT prophylaxis  Transfuse if actively bleeding or if hemoglobin less than 7 g percent Monitor INR   INFECTIOUS A:   Possible aspiration P:   Empiric abx as above Trend fever curve / WBC / PCT   ENDOCRINE A:   Hypoglycemia   P:   Monitor glucose on BMP   NEUROLOGIC A:   Acute encephalopathy - in setting of SBO, AMS, hypotension P:   RASS goal:  0  Minimize fentanyl as able with SBO PRN versed for sedation   FAMILY  - Updates:  Daughter Alona Bene updated at the bedside extensively,she is agreeable to withdrawal, son Greggory Stallion is hesitant & hopeful  - Inter-disciplinary family meet or Palliative Care meeting due by:   Done by RA  with Son & Daughter.  The patient is critically ill with multiple organ systems failure and requires high complexity decision making for assessment and support, frequent evaluation and titration of therapies, application of advanced monitoring technologies and extensive interpretation of multiple databases. Critical Care Time devoted to patient care services described in this note independent of APP time is 32 minutes.    Cyril Mourning MD. Tonny Bollman. [place] [facility] Pager [phone] If no response call [phone]    12/25/2015, 10:50 AM

## 2015-12-25 NOTE — Progress Notes (Signed)
Informed Dr Vassie Loll about pt hypotension and no urine output.  Erick Blinks, RN

## 2015-12-25 NOTE — Progress Notes (Signed)
Nutrition Follow-up  DOCUMENTATION CODES:   Underweight  INTERVENTION:  If deemed medically appropriate (no evidence of bowel ischemia) :  recommend begin Vital AF 1.2 @ 21mL/hr increase by 10 every 4 hours to goal rate of 29mL/hr.   TF regimen provides 1152 calories (98% of needs), 72gm protein, 779cc free H2O.   NUTRITION DIAGNOSIS:   Inadequate oral intake related to inability to eat as evidenced by NPO status.  ongoing  GOAL:   Patient will meet greater than or equal to 90% of their needs  Not meeting  MONITOR:   Vent status, Labs, Weight trends, I & O's  REASON FOR ASSESSMENT:   Ventilator    ASSESSMENT:   82 year old female with a history of symptomatic bradycardia status post pacemaker insertion, paroxysmal atrial fibrillation on anticoagulation for the last 5 years, hypertension, who presents to the ER today due to rectal bleeding, hemoglobin remained stable during hospital stay, did not require any transfusion, status post EGD by Dr. Ewing Schlein 12/26/2015, significant for severe gastritis with ulceration, possibly ischemic.  Patient is currently intubated on ventilator support MV: 12.7 L/min Temp (24hrs), Avg:96.7 F (35.9 C), Min:96.3 F (35.7 C), Max:97.9 F (36.6 C)  Propofol: None  Spoke about pt at rounds this AM. Patient is on maximum dose of neo-synephrine @ . Currently, family, MD, is waiting on Son to arrive to discuss goals of care as he is HCPOA. Even on max dose of neo, multiple NS boluses, pt's BP has been low.  Continue to monitor for GOC, MD workup, pt is at high risk for bowel ischemia per Pulmonology note with history of SBO and Rectal Bleeding.  Labs reviewed. Medications reviewed.  Diet Order:  Diet NPO time specified  Skin:  Reviewed, no issues  Last BM:  2/5  Height:   Ht Readings from Last 1 Encounters:  12/22/15  (1.575 m)    Weight:   Wt Readings from Last 1 Encounters:  12/24/15 99 lb 6.8 oz (45.1 kg)     Ideal Body Weight:  50 kg  BMI:  Body mass index is 18.18 kg/(m^2).  Estimated Nutritional Needs:   Kcal:  1175  Protein:  65-75g  Fluid:  >1.2L/day  EDUCATION NEEDS:   No education needs identified at this time  Dionne Ano. Fabyan Loughmiller, MS, RD LDN After Hours/Weekend Pager [phone]

## 2015-12-25 NOTE — Progress Notes (Signed)
Pharmacy Antibiotic Note  [name] [name] is a 82 y.o. female admitted on 12/31/2015 with pneumonia.  Pharmacy has been consulted for Zosyn dosing.  Plan: After discussion with Dr. Vassie Loll, Zosyn changed to 2.25g IV q8h for 7 day course.  Since no further lab monitoring desired pharmacy will sign off.  Please re-consult if clinical situation changes.   Height:  (157.5 cm) Weight: 99 lb 6.8 oz (45.1 kg) IBW/kg (Calculated) : 50.1  Temp (24hrs), Avg:96.9 F (36.1 C), Min:96.3 F (35.7 C), Max:98.1 F (36.7 C)   Recent Labs Lab 01/01/2016 1309 01/11/2016 1649 Jan 20, 2016 0230 01/20/2016 0315 12/22/15 0124 12/23/15 0410  WBC 6.0 5.3 4.6  --  3.4* 5.2  CREATININE 1.60*  --   --  1.53* 1.87*  --   LATICACIDVEN  --   --   --   --  3.3*  --     Estimated Creatinine Clearance: 13.4 mL/min (by C-G formula based on Cr of 1.87).    No Known Allergies  Antimicrobials this admission: Zosyn 2/6 >>   Dose adjustments this admission: Zosyn 2.25g IV q8h due to patient anuric  Microbiology results: 2/4 MRSA PCR (-)  Thank you for allowing pharmacy to be a part of this patient's care.  Armanda Heritage  [facility] PharmD. Candidate 12/25/2015 11:46 AM

## 2015-12-25 NOTE — Progress Notes (Signed)
eLink Physician-Brief Progress Note Patient Name: [name] [name] DOB: [birth year] MRN: [id number]   Date of Service  12/25/2015  HPI/Events of Note  Hypotension - BP = 57/37. Patient on Phenylephrine at 200 mcg/hour which is her ceiling as she is a limited code.   eICU Interventions  Will bolus with 0.9 NaCl 1 liter IV over 1 hour now.      Intervention Category Major Interventions: Hypotension - evaluation and management  Audie Wieser Eugene 12/25/2015, 9:41 PM

## 2015-12-26 LAB — GLUCOSE, CAPILLARY
GLUCOSE-CAPILLARY: 38 mg/dL — AB (ref 65–99)
GLUCOSE-CAPILLARY: 73 mg/dL (ref 65–99)
GLUCOSE-CAPILLARY: 82 mg/dL (ref 65–99)
GLUCOSE-CAPILLARY: 67 mg/dL (ref 65–99)
GLUCOSE-CAPILLARY: 42 mg/dL — AB (ref 65–99)
GLUCOSE-CAPILLARY: 53 mg/dL — AB (ref 65–99)
Glucose-Capillary: 120 mg/dL — ABNORMAL HIGH (ref 65–99)
Glucose-Capillary: 295 mg/dL — ABNORMAL HIGH (ref 65–99)
Glucose-Capillary: 34 mg/dL — CL (ref 65–99)
Glucose-Capillary: 366 mg/dL — ABNORMAL HIGH (ref 65–99)
Glucose-Capillary: 50 mg/dL — ABNORMAL LOW (ref 65–99)
Glucose-Capillary: 81 mg/dL (ref 65–99)
Glucose-Capillary: 95 mg/dL (ref 65–99)

## 2015-12-26 MED ORDER — DEXTROSE 50 % IV SOLN
INTRAVENOUS | Status: AC
  Administered 2015-12-26: 50 mL
  Filled 2015-12-26: qty 50

## 2015-12-26 MED ORDER — CHLORHEXIDINE GLUCONATE 0.12 % MT SOLN
OROMUCOSAL | Status: AC
  Filled 2015-12-26: qty 15

## 2015-12-26 MED ORDER — DEXTROSE 50 % IV SOLN
INTRAVENOUS | Status: AC
  Filled 2015-12-26: qty 50

## 2015-12-26 MED ORDER — DEXTROSE 10 % IV SOLN
INTRAVENOUS | Status: DC
  Administered 2015-12-26 (×2): via INTRAVENOUS

## 2015-12-26 MED ORDER — SODIUM CHLORIDE 0.9 % IV SOLN
INTRAVENOUS | Status: DC
  Administered 2015-12-26: 05:00:00 via INTRAVENOUS

## 2015-12-26 MED ORDER — DEXTROSE 50 % IV SOLN
25.0000 g | Freq: Once | INTRAVENOUS | Status: AC
  Administered 2015-12-26: 25 g via INTRAVENOUS

## 2015-12-26 NOTE — Progress Notes (Signed)
eLink Physician-Brief Progress Note Patient Name: [name] [name] DOB: September 07, 1921 MRN: [id number]   Date of Service  12/26/2015  HPI/Events of Note  RN notified of hyperglycemia. IVF contains dextrose.  eICU Interventions  Switch IVF to NS maintenance.      Intervention Category Intermediate Interventions: Hyperglycemia - evaluation and treatment  Lawanda Cousins 12/26/2015, 4:29 AM

## 2015-12-26 NOTE — Progress Notes (Signed)
PULMONARY / CRITICAL CARE MEDICINE   Name: [name] [name] MRN: [id number] DOB: 1921-04-27    ADMISSION DATE:  2016-01-03 CONSULTATION DATE:  12/30/2015  REFERRING MD:  [facility] hospitalist  CHIEF COMPLAINT:  Confusion, hypotension, abd distension admitted 2/4 with small bowel obstruction and rectal bleeding EGD showing gastritis? Ischemic, required emergent intubation 2/6 AM for unresponsiveness now on pressors for hypotension-Course very concerning for bowel ischemia   SUBJECTIVE: No acute events, BP running 50's systolic on 200 mcg neo.     VITAL SIGNS: BP 55/19 mmHg  Pulse 71  Temp(Src) 91.8 F (33.2 C) (Core (Comment))  Resp 25  Ht  (1.575 m)  Wt 99 lb 6.8 oz (45.1 kg)  BMI 18.18 kg/m2  SpO2 92%  HEMODYNAMICS:    VENTILATOR SETTINGS: Vent Mode:  [-] PRVC FiO2 (%):  [40 %] 40 % Set Rate:  [22 bmp] 22 bmp Vt Set:  [400 mL] 400 mL PEEP:  [5 cmH20] 5 cmH20 Plateau Pressure:  [19 cmH20-25 cmH20] 23 cmH20  INTAKE / OUTPUT: I/O last 3 completed shifts: In: 19.3 [I.V.:5393.3; Other:1000; IV Piggyback:250] Out: 1717 [Urine:17; Emesis/NG output:1700]  PHYSICAL EXAMINATION: General:  Frail elderly female in NAD on vent Neuro: no response to verbal stimuli HEENT:  ETT, mm pink/moist Cardiovascular: s1s2 rrr, no m/r/g Lungs:  Even/non-labored, lungs bilaterally clear  Abdomen:  Mild distention, firm  Musculoskeletal:  No cyanosis. No clubbing. No edema Skin:  Blisters on UE, generalized 1-2+ edema,  cool to touch  LABS: PULMONARY  Recent Labs Lab 12/22/15 0119 12/22/15 0345  PHART 7.223* 7.142*  PCO2ART 42.1 48.3*  PO2ART 106* 62.7*  HCO3 16.6* 15.8*  TCO2 16.3 16.1  O2SAT 96.6 84.6    CBC  Recent Labs Lab 12/24/2015 0230 01/01/2016 1600 12/22/15 0124 12/23/15 0410  HGB 11.2* 10.9* 9.4* 8.2*  HCT 33.8* 33.4* 29.1* 25.2*  WBC 4.6  --  3.4* 5.2  PLT 111*  --  116* 98*    COAGULATION  Recent Labs Lab 01/03/2016 1309 12/23/2015 0315 12/22/15 0124  12/23/15 0410  INR 1.90* 1.53* 1.76* 2.79*    CARDIAC  Recent Labs Lab 01/03/16 1309 2016/01/03 2031 12/30/2015 0230 12/22/15 0124  TROPONINI 0.07* 0.11* 0.13* 0.09*   No results for input(s): PROBNP in the last 168 hours.   CHEMISTRY  Recent Labs Lab 01/03/16 1309 01/10/2016 0315 12/22/15 0124  NA 140 140 145  K 4.1 4.6 4.3  CL 106 111 117*  CO2 19* 19* 18*  GLUCOSE 250* 131* 118*  BUN 43* 39* 47*  CREATININE 1.60* 1.53* 1.87*  CALCIUM 9.7 8.4* 8.2*  MG 2.2  --   --    Estimated Creatinine Clearance: 13.4 mL/min (by C-G formula based on Cr of 1.87).   LIVER  Recent Labs Lab 2016/01/03 1309 01/11/2016 0315 12/22/15 0124 12/23/15 0410  AST 36 30 42*  --   ALT 11* 10* 9*  --   ALKPHOS 48 34* 25*  --   BILITOT 0.9 0.6 0.3  --   PROT 7.5 5.9* 5.0*  --   ALBUMIN 4.1 3.2* 2.7*  --   INR 1.90* 1.53* 1.76* 2.79*    INFECTIOUS  Recent Labs Lab 12/22/15 0124 12/22/15 0130 12/23/15 0410  LATICACIDVEN 3.3*  --   --   PROCALCITON  --  7.80 37.83    ENDOCRINE CBG (last 3)   Recent Labs  12/26/15 0758 12/26/15 0819 12/26/15 0822  GLUCAP 38* 34* 73    IMAGING x48h No results found.  EVENTS / STUDIES: 2/04  Admit to [facility] with rectal bleeding, found to have SBO 2/05  EGD >> gastritis, concern for ischemia  2/06  CT ABD/Pelvis >>  2/06  ECHO >> LVEF 25-30%, diffuse hypokinesis, grade 1 diastolic dysfunction, left pleural effusion, moderate LAE, reduced RV function, mod TR, RVSP 30 2/7 CT abd, no contrast  >> SBO   LINES / TUBES: ETT 2/6 >>   ABX: Zosyn 2/5 >>   CULTURES:    DISCUSSION:  82 y/o F admitted with rectal bleeding 2/4.  KUB concerning for SBO.  EGD on 2/5 showed gastritis probably of ischemic variety.  She later became unresponsive with agonal breathing (sats in 40's), treated with 1L NS and bipap with some improvement.  Follow up imaging concerning for new RLL infiltrate and worsening SBO.   patient intubated and placed on neo for  hypotension.     ASSESSMENT / PLAN:  PULMONARY A: Suspected Aspiration - likely associated with low lung volume secondary to acute abdominal distention.  Acute hypoxemic resp failure - s/p intubation 2/5 P:   MV support, 8 cc/kg  PRN Albuterol  Family likely to withdraw support on Sat 2/11  CARDIOVASCULAR A:  Hx CAD status post pacemaker Hypothermia Hypotension - initially resolved with fluids. Likely dehydration due to nothing by mouth and small bowel obstruction, MI ruled out P:  Neo for MAP >65, cap at 200 mcg.  No addition of other pressors. LCB - no CPR or cardioversion  Bair hugger as needed for normothermia  RENAL A:   Acute kidney injury - secondary to dehydration, decreased UOP  Lactic Acidosis  P:   Trend BMP / UOP  Replace electrolytes as indicated  drop D10 at 50 ml/hr  GASTROINTESTINAL A:   Rectal Bleeding / Melena  SBO - noted on KUB on admit Gastritis - noted on EGD, concern for bowel ischemia P:   BID PPI No further interventions per GI at this time, signed off NPO  OGT to LIS for gastric decompression    HEMATOLOGIC A:   Anemia - due to bleeding and chronic illness Coagulopathy - in setting of coumadin S/p Vitamin K  P:  Trend CBC SCD's for DVT prophylaxis  Transfuse if actively bleeding or if hemoglobin less than 7 g percent Monitor INR   INFECTIOUS A:   Possible aspiration P:   Empiric abx as above Trend fever curve / WBC / PCT   ENDOCRINE A:   Hypoglycemia   P:   Monitor glucose on BMP   NEUROLOGIC A:   Acute encephalopathy - in setting of SBO, AMS, hypotension P:   RASS goal: 0  Minimize fentanyl as able with SBO PRN versed for sedation   FAMILY  - Updates:  No family at bedside am 2/10.  Will follow up later in am.  Anticipate withdrawal of care after discussion with family on 2/11.  They have indicated they would like to proceed on Saturday.  Son has been hesitant (lives in [place]).  Daughter lives with patient and  feels she is suffering but does not want to "go against" her brother.    - Inter-disciplinary family meet or Palliative Care meeting due by:   Done by RA  with Son & Daughter.   Canary Brim, NP-C [place] [facility] Pgr: [phone] or if no answer [phone] 12/26/2015, 9:22 AM

## 2015-12-26 NOTE — Progress Notes (Signed)
Inpatient Diabetes Program Recommendations  AACE/ADA: New Consensus Statement on Inpatient Glycemic Control (2015)  Target Ranges:  Prepandial:   less than 140 mg/dL      Peak postprandial:   less than 180 mg/dL (1-2 hours)      Critically ill patients:  140 - 180 mg/dL   Results for AVIYAH, SWETZ (MRN [id number]) as of 12/26/2015 09:30  Ref. Range 12/24/2015 23:32 12/25/2015 03:17 12/25/2015 08:01 12/25/2015 12:40 12/25/2015 18:08 12/25/2015 19:54  Glucose-Capillary Latest Ref Range: 65-99 mg/dL 409 (H) 811 (H) 914 (H) 145 (H) 132 (H) 191 (H)    Results for SHANIYA, TASHIRO (MRN [id number]) as of 12/26/2015 09:30  Ref. Range 12/26/2015 00:44 12/26/2015 04:04 12/26/2015 07:58 12/26/2015 08:19 12/26/2015 08:22  Glucose-Capillary Latest Ref Range: 65-99 mg/dL 086 (H) 578 (H) 38 (LL) 34 (LL) 73    Current Insulin Orders: Novolog Sensitive SSI (0-9 units) Q4 hours     -CBGs on 01/09 were stable.  Patient required small doses of Novolog SSI throughout the day.  -Extreme Hyperglycemia at midnight and 4am.  Patient received 5 units Novolog at midnight and 9 units at 4am.  By 8am, CBG dropped sharply to 38 mg/dl.  -Not sure why patient's CBGs rose so sharply at midnight and 4am?    -Note D10% IVF started at 9am today.     --Will follow patient during hospitalization--  Ambrose Finland RN, MSN, CDE Diabetes Coordinator Inpatient Glycemic Control Team Team Pager: [phone] (8a-5p)

## 2015-12-26 NOTE — Progress Notes (Signed)
[name] met pt's family in ICU waiting area. Pt's daughter (w/whom she lives) son and daughter-in-law were present. Pt's son quickly asked for prayer. Son said we are going to take life support away tomorrow and he said he didn't feel he like he had much of a choice. We talked about their faith and pt's daughter said her mother may not make it. She still asked for healing for their mother that they may be able to hear her voice again. [name] listened empathically and support their hope and allowed them to express their feelings of reality. Please page before extubation Sat as family wants Cut Off present during that time. Chaplain Marlise Eves Holder   12/26/15 1800  Clinical Encounter Type  Visited With Family

## 2015-12-26 NOTE — Progress Notes (Signed)
1914 pt cbg was 38.  Administered 25 g of d50 IV and rechecked cbg which was 73.  Informed Dr. Vassie Loll and changed IV fluids to D10 at 33ml/hr.  Erick Blinks, RN

## 2015-12-27 ENCOUNTER — Other Ambulatory Visit: Payer: Self-pay | Admitting: Internal Medicine

## 2016-01-01 ENCOUNTER — Telehealth: Payer: Self-pay

## 2016-01-01 NOTE — Telephone Encounter (Signed)
On 01/01/2016 I received a death certificate from [employer] (original). The death certificate is for burial. The patient is a patient of Doctor Vassie Loll. The death certificate will be taken to [facility] ([id number]) this pm for signature. On January 05, 2016 I received the death certificate back from Doctor Vassie Loll. I got the death certificate ready and called the funeral home to let them know the death certificate is ready for pickup.

## 2016-01-06 ENCOUNTER — Ambulatory Visit: Payer: Self-pay

## 2016-01-06 ENCOUNTER — Encounter: Payer: Medicare Other | Admitting: Internal Medicine

## 2016-01-06 ENCOUNTER — Other Ambulatory Visit: Payer: Self-pay | Admitting: Internal Medicine

## 2016-01-06 DIAGNOSIS — Z5181 Encounter for therapeutic drug level monitoring: Secondary | ICD-10-CM

## 2016-01-06 DIAGNOSIS — I480 Paroxysmal atrial fibrillation: Secondary | ICD-10-CM

## 2016-01-14 NOTE — Discharge Summary (Signed)
PULMONARY / CRITICAL CARE MEDICINE   Name: [name] [name] MRN: [id number] DOB: [birth year]    ADMISSION DATE:  12/28/2015 CONSULTATION DATE:  12/19/2015  REFERRING MD:  [facility]  CHIEF COMPLAINT:  Confusion, hypotension, abd distension admitted 2/4 with small bowel obstruction and rectal bleeding EGD showing gastritis? Ischemic, required emergent intubation 2/6 AM for unresponsiveness now on pressors for hypotension-Course very concerning for bowel ischemia    EVENTS / STUDIES: 2/04  Admit to [facility] with rectal bleeding, found to have SBO 2/05  EGD >> gastritis, concern for ischemia  2/06  CT ABD/Pelvis >>  2/06  ECHO >> LVEF 25-30%, diffuse hypokinesis, grade 1 diastolic dysfunction, left pleural effusion, moderate LAE, reduced RV function, mod TR, RVSP 30 2/7 CT abd, no contrast  >> SBO   LINES / TUBES: ETT 2/6 >>   ABX: Zosyn 2/5 >>    DISCUSSION:  82 y/o F admitted with rectal bleeding 2/4.  KUB concerning for SBO.  EGD on 2/5 showed gastritis probably of ischemic variety.  She later became unresponsive with agonal breathing (sats in 40's), treated with 1L NS and bipap with some improvement.  Follow up imaging concerning for new RLL infiltrate and worsening SBO.   patient intubated and placed on neo for hypotension.     ASSESSMENT / PLAN:  PULMONARY A: Suspected Aspiration - likely associated with low lung volume secondary to acute abdominal distention.  Acute hypoxemic resp failure - s/p intubation 2/5 P:   MV support, 8 cc/kg   CARDIOVASCULAR A:  Hx CAD status post pacemaker Hypothermia Hypotension - initially resolved with fluids. Likely dehydration due to nothing by mouth and small bowel obstruction, MI ruled out P:  Neo for MAP >65, cap at 200 mcg.  No addition of other pressors. LCB - no CPR or cardioversion  Bair hugger as needed for normothermia  RENAL A:   Acute kidney injury - secondary to dehydration, decreased UOP  Lactic Acidosis  P:       GASTROINTESTINAL A:   Rectal Bleeding / Melena  SBO - noted on KUB on admit Gastritis - noted on EGD, concern for bowel ischemia P:   BID PPI No further interventions per GI , signed off NPO  OGT to LIS for gastric decompression    HEMATOLOGIC A:   Anemia - due to bleeding and chronic illness Coagulopathy - in setting of coumadin S/p Vitamin K  P:  Trend CBC SCD's for DVT prophylaxis  Transfuse if actively bleeding or if hemoglobin less than 7 g percent Monitor INR   INFECTIOUS A:   Possible aspiration P:   Empiric abx as above Trend fever curve / WBC / PCT   ENDOCRINE A:   Hypoglycemia   P:   Monitor glucose on BMP   NEUROLOGIC A:   Acute encephalopathy - in setting of SBO, AMS, hypotension P:   RASS goal: 0  Minimize fentanyl as able with SBO PRN versed for sedation   FAMILY  - Updates:   Anticipate withdrawal of care after discussion with family on 2/11.  They have indicated they would like to proceed on Saturday.  Son has been hesitant (lives in [place]).  Daughter lives with patient and feels she is suffering but does not want to "go against" her brother.    - Inter-disciplinary family meet or Palliative Care meeting due by:   Done by RA  with Son & Daughter.  Course - she remained pressor dependent throughout her ICU course, small bowel  obstruction versus conservatively managed with  nasogastric decompression. She passed away on Jan 05, 2023  Cause of death- small bowel obstruction possibly due to ischemic bowel, septic shock,  acute respiratory failure  Oretha Milch. MD  01/01/2016, 12:25 PM

## 2016-01-14 NOTE — Progress Notes (Signed)
CH arrived to bedside shortly after pt passed. Pt's daughter, son and daughter-in-law were present and appropriately grieving and beginning to process their loss. Although their loss were painful, it was accompanied by relief and their faith in knowing where she is. Family asked for prayer bedside of pt. Chaplain Marjory Lies, M.Div.   01/07/2016 0200  Clinical Encounter Type  Visited With Family

## 2016-01-14 DEATH — deceased

## 2017-02-08 IMAGING — DX DG CHEST 1V PORT
1 series · 1 of 1 positions shown · non-contrast
Comparison: 05/10/2008

CLINICAL DATA: Epigastric pain

EXAM:
PORTABLE CHEST 1 VIEW

[chest ap]
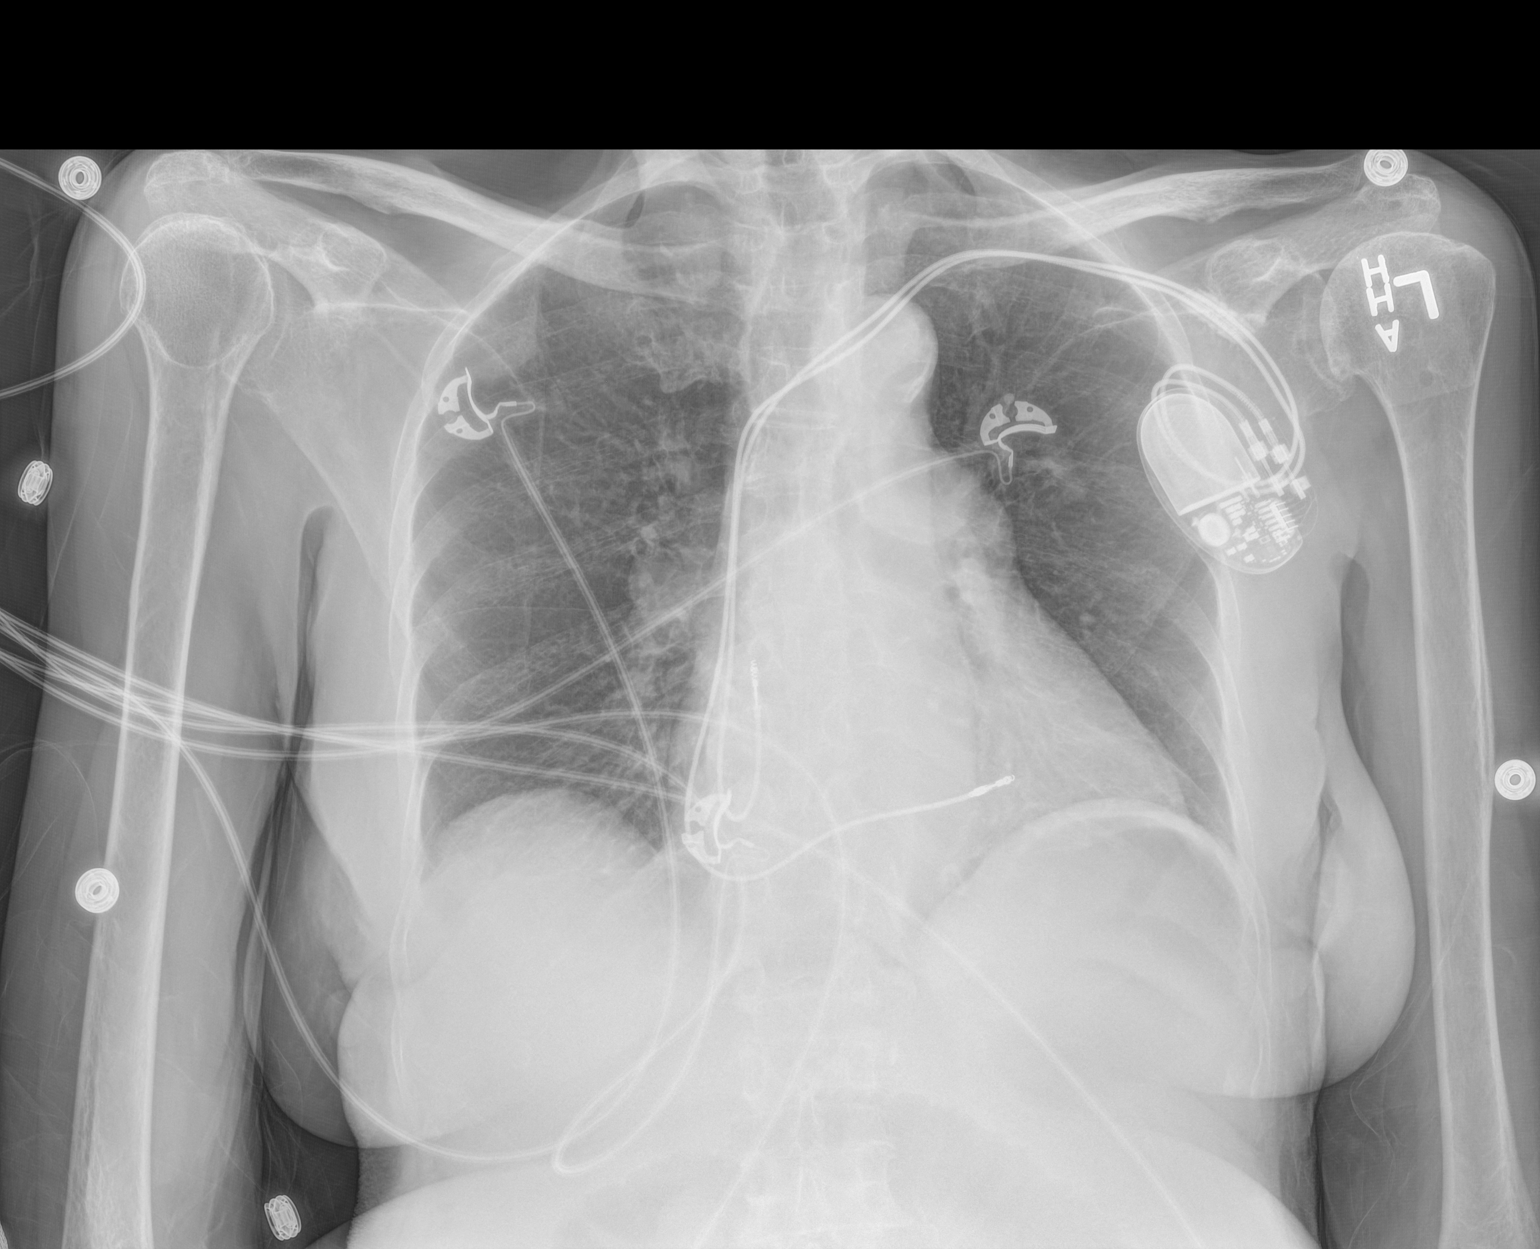

[1 of 1 positions shown; findings below may reference images not displayed]

FINDINGS: Heart size upper normal. Negative for heart failure. Dual lead
pacemaker unchanged with leads in the right atrium and right
ventricle.

Lungs are clear without infiltrate effusion or mass. No change from
the prior study
IMPRESSION: No active disease.

## 2017-02-08 IMAGING — DX DG ABD PORTABLE 1V
1 series · 1 of 1 positions shown · non-contrast
Comparison: None.

CLINICAL DATA: Epigastric pain

EXAM:
PORTABLE ABDOMEN - 1 VIEW

[abdomen kub]
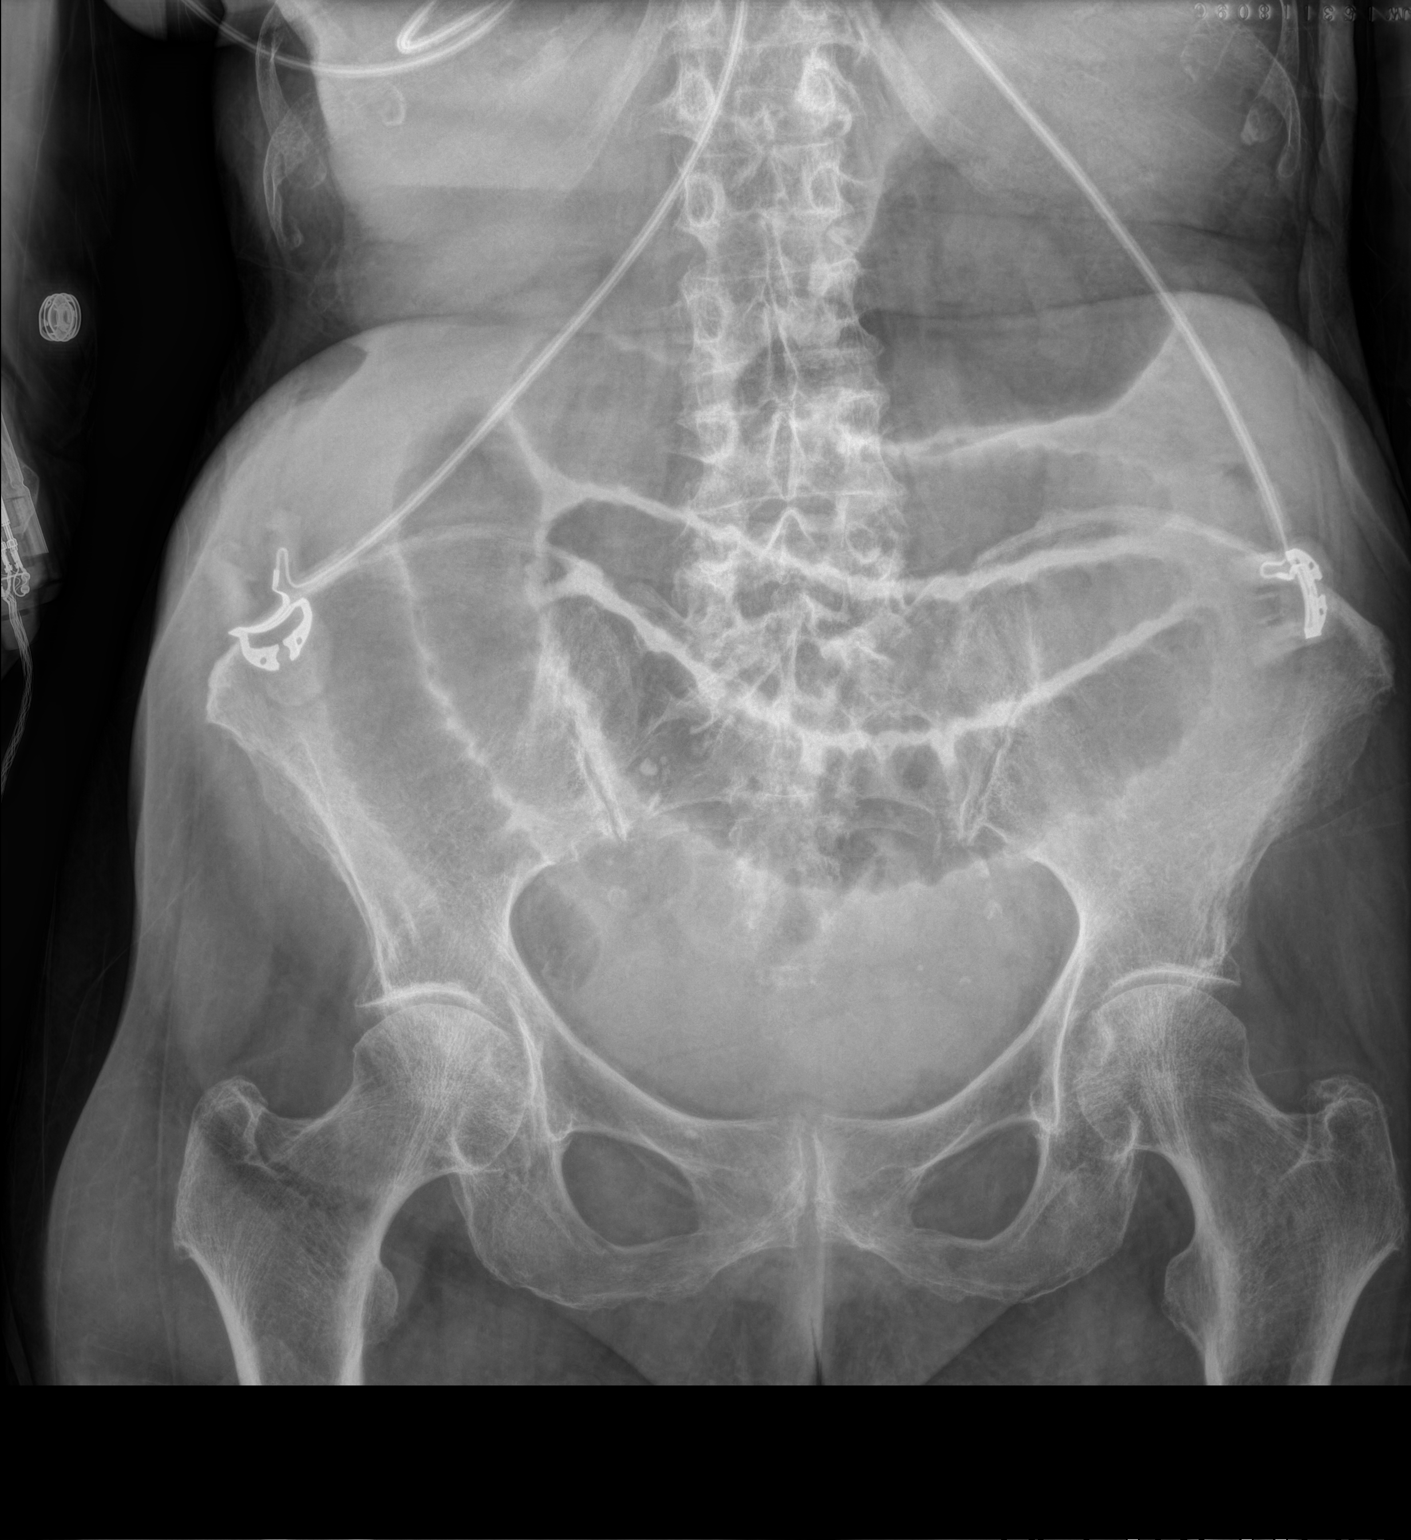

[1 of 1 positions shown; findings below may reference images not displayed]

FINDINGS: Dilated small bowel loops in the mid abdomen. There is gas in the
stomach which is nondilated. Gasless colon. No acute skeletal
abnormality.
IMPRESSION: Dilated small bowel loops suggesting small-bowel obstruction.

## 2017-02-10 IMAGING — DX DG CHEST 1V PORT
1 series · 1 of 1 positions shown · non-contrast
Comparison: Radiograph dated 12/20/2015

CLINICAL DATA: [AGE] female with abdominal pain

EXAM:
PORTABLE CHEST 1 VIEW

[chest ap]
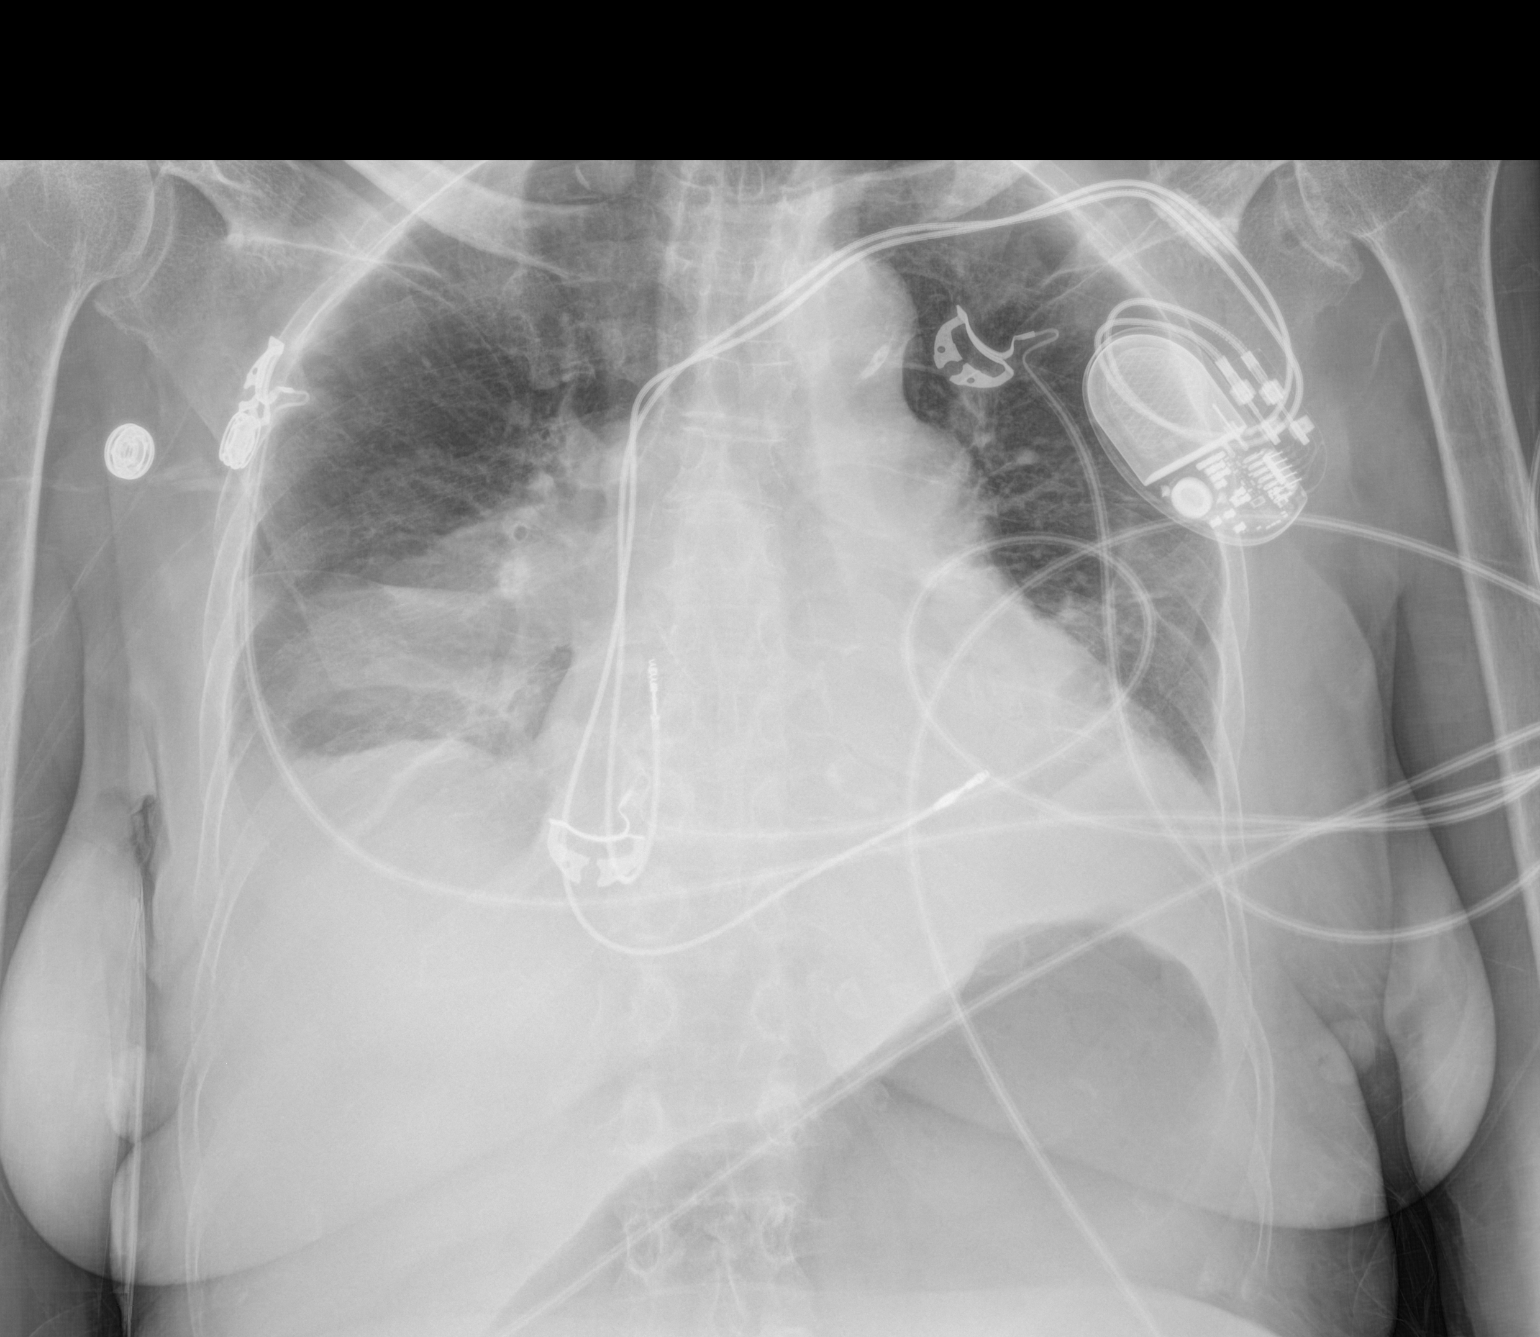

[1 of 1 positions shown; findings below may reference images not displayed]

FINDINGS: There is a patchy area of opacity in the right mid and lower lung
field compatible with atelectasis/ pneumonia. Small right pleural
effusion may be present. The left lung is clear. No pneumothorax.
There is cardiomegaly. Left pectoral pacemaker device. Degenerative
changes the spine.
IMPRESSION: Right lower lung field consolidation.

## 2017-02-10 IMAGING — DX DG ABD PORTABLE 1V
1 series · 1 of 1 positions shown · non-contrast
Comparison: Abdominal radiograph performed 12/20/2015

CLINICAL DATA: Acute onset of hypotension and hypoxic respiratory
failure. Abdominal distention. Initial encounter.

EXAM:
PORTABLE ABDOMEN - 1 VIEW

[abdomen kub]
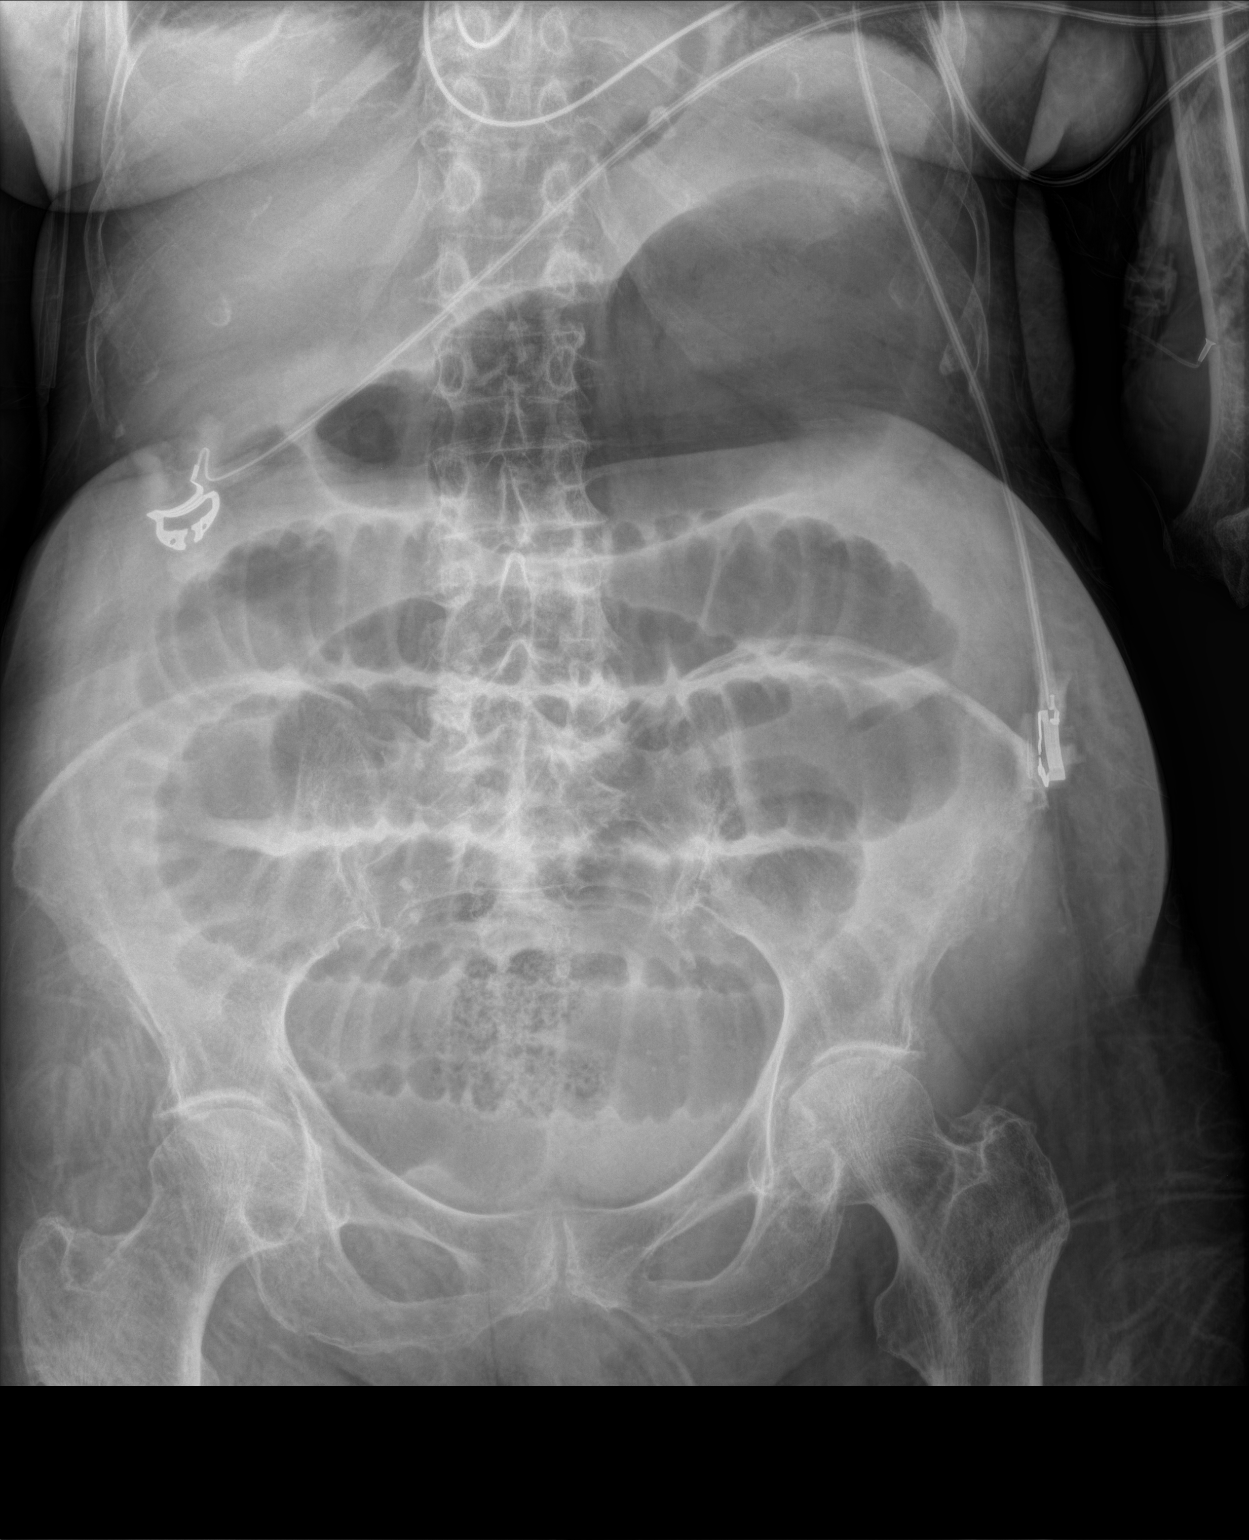

[1 of 1 positions shown; findings below may reference images not displayed]

FINDINGS: There is mildly worsening diffuse dilatation of small-bowel loops to
4.3 cm in maximal diameter, concerning for small bowel obstruction.
The stomach is also filled with air. The colon is not well seen. No
free intra-abdominal air is identified, though evaluation for free
air is limited on a single supine view.

No acute osseous abnormalities are identified.
IMPRESSION: Mildly worsening diffuse dilatation of small-bowel loops to 4.3 cm
in maximal diameter, concerning for worsening small bowel
obstruction. No free intra-abdominal air seen.

## 2017-02-11 IMAGING — DX DG CHEST 1V PORT
1 series · 1 of 1 positions shown · non-contrast
Comparison: 12/22/2015.  12/22/2015.  12/20/2015.

CLINICAL DATA: Respiratory failure.

EXAM:
PORTABLE CHEST 1 VIEW

[chest ap]
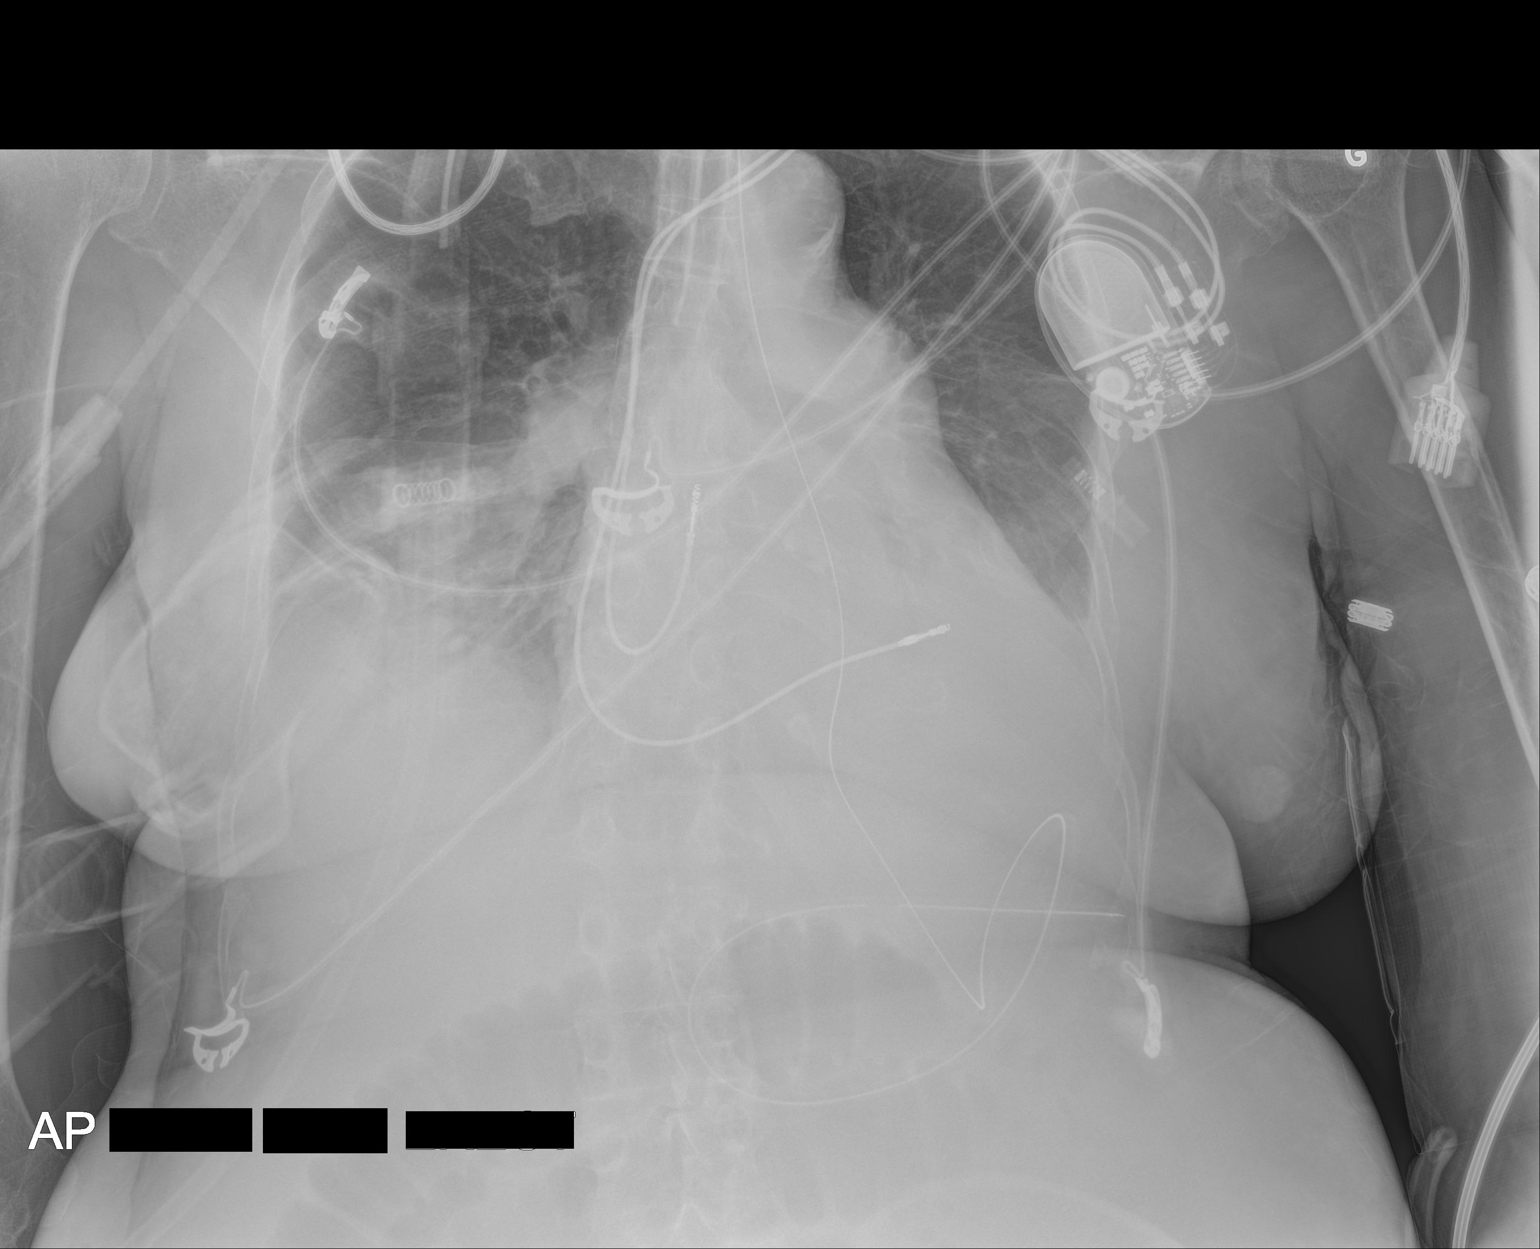

[1 of 1 positions shown; findings below may reference images not displayed]

FINDINGS: Endotracheal tube tip 9 mm above the lower portion of the carina.
Proximal repositioning of approximately 2 cm should be considered.
NG tube noted with tip projected over the stomach. NG tube courses
to the left in the chest this may be related to slight patient
rotation and tortuous esophagus. Interim resolution of gastric
distention. Distended loops of small bowel again noted. For further
evaluation. Stable cardiomegaly. No pulmonary venous congestion. Low
lung volumes with bibasilar atelectasis and/or infiltrates. No
pneumothorax. No acute osseous abnormality.
IMPRESSION: 1. Endotracheal tube noted with tip 9 mm above the lower portion of
the carina. Proximal repositioning of approximately 2 cm should be
considered. NG tube noted with tip projected over the stomach as
above.
2. Low lung volumes with persistent bibasilar atelectasis and/or
infiltrates.
3. Interim resolution of gastric distention. Distended loops of
small bowel again noted.
